# Patient Record
Sex: Female | Born: 1995 | Race: Black or African American | Hispanic: No | Marital: Single | Smoking: Former smoker
Health system: Southern US, Community
[De-identification: ages and names within clinical notes are randomized; demographics above are authoritative.]

## PROBLEM LIST (undated history)

## (undated) ENCOUNTER — Inpatient Hospital Stay (HOSPITAL_COMMUNITY): Payer: Self-pay

## (undated) ENCOUNTER — Ambulatory Visit: Source: Home / Self Care

## (undated) DIAGNOSIS — N83209 Unspecified ovarian cyst, unspecified side: Secondary | ICD-10-CM

## (undated) DIAGNOSIS — I10 Essential (primary) hypertension: Secondary | ICD-10-CM

## (undated) DIAGNOSIS — A749 Chlamydial infection, unspecified: Secondary | ICD-10-CM

## (undated) HISTORY — PX: NO PAST SURGERIES: SHX2092

---
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
## 2013-12-02 ENCOUNTER — Encounter (HOSPITAL_COMMUNITY): Payer: Self-pay

## 2013-12-20 ENCOUNTER — Inpatient Hospital Stay (HOSPITAL_COMMUNITY)
Admission: AD | Admit: 2013-12-20 | Discharge: 2013-12-20 | Disposition: A | Discharge status: Home / Self Care | Payer: Self-pay | Source: Ambulatory Visit | Attending: Family Medicine | Admitting: Family Medicine

## 2013-12-20 ENCOUNTER — Encounter (HOSPITAL_COMMUNITY): Payer: Self-pay

## 2013-12-20 DIAGNOSIS — N898 Other specified noninflammatory disorders of vagina: Secondary | ICD-10-CM | POA: Insufficient documentation

## 2013-12-20 DIAGNOSIS — Z3202 Encounter for pregnancy test, result negative: Secondary | ICD-10-CM | POA: Insufficient documentation

## 2013-12-20 DIAGNOSIS — N926 Irregular menstruation, unspecified: Secondary | ICD-10-CM

## 2013-12-20 DIAGNOSIS — Z113 Encounter for screening for infections with a predominantly sexual mode of transmission: Secondary | ICD-10-CM

## 2013-12-20 LAB — RAPID HIV SCREEN (WH-MAU): Rapid HIV Screen: NONREACTIVE

## 2013-12-20 LAB — WET PREP, GENITAL
Clue Cells Wet Prep HPF POC: NONE SEEN
Trich, Wet Prep: NONE SEEN
YEAST WET PREP: NONE SEEN

## 2013-12-20 LAB — POCT PREGNANCY, URINE: PREG TEST UR: NEGATIVE

## 2013-12-20 LAB — URINALYSIS, ROUTINE W REFLEX MICROSCOPIC
Bilirubin Urine: NEGATIVE
GLUCOSE, UA: NEGATIVE mg/dL
HGB URINE DIPSTICK: NEGATIVE
Ketones, ur: NEGATIVE mg/dL
LEUKOCYTES UA: NEGATIVE
NITRITE: NEGATIVE
Protein, ur: NEGATIVE mg/dL
Specific Gravity, Urine: 1.02 (ref 1.005–1.030)
UROBILINOGEN UA: 0.2 mg/dL (ref 0.0–1.0)
pH: 7 (ref 5.0–8.0)

## 2013-12-20 NOTE — MAU Note (Signed)
Pt presents complaining of a missed period and wants STD testing. Pt has missed a period in November and has history of irregular periods. Complaining of white discharge in underwear

## 2013-12-20 NOTE — Discharge Instructions (Signed)
Secondary Amenorrhea  Secondary amenorrhea is the stopping of menstrual flow for 3-6 months in a female who has previously had periods. There are many possible causes. Most of these causes are not serious. Usually, treating the underlying problem causing the loss of menses will return your periods to normal. CAUSES  Some common and uncommon causes of not menstruating include:  Malnutrition.  Low blood sugar (hypoglycemia).  Polycystic ovary disease.  Stress or fear.  Breastfeeding.  Hormone imbalance.  Ovarian failure.  Medicines.  Extreme obesity.  Cystic fibrosis.  Low body weight or drastic weight reduction from any cause.  Early menopause.  Removal of ovaries or uterus.  Contraceptives.  Illness.  Long-term (chronic) illnesses.  Cushing syndrome.  Thyroid problems.  Birth control pills, patches, or vaginal rings for birth control. RISK FACTORS You may be at greater risk of secondary amenorrhea if:  You have a family history of this condition.  You have an eating disorder.  You do athletic training. DIAGNOSIS  A diagnosis is made by your health care provider taking a medical history and doing a physical exam. This will include a pelvic exam to check for problems with your reproductive organs. Pregnancy must be ruled out. Often, numerous blood tests are done to measure different hormones in the body. Urine testing may be done. Specialized exams (ultrasound, CT scan, MRI, or hysteroscopy) may have to be done as well as measuring the body mass index (BMI). TREATMENT  Treatment depends on the cause of the amenorrhea. If an eating disorder is present, this can be treated with an adequate diet and therapy. Chronic illnesses may improve with treatment of the illness. Amenorrhea may be corrected with medicines, lifestyle changes, or surgery. If the amenorrhea cannot be corrected, it is sometimes possible to create a false menstruation with medicines. HOME CARE  INSTRUCTIONS  Maintain a healthy diet.  Manage weight problems.  Exercise regularly but not excessively.  Get adequate sleep.  Manage stress.  Be aware of changes in your menstrual cycle. Keep a record of when your periods occur. Note the date your period starts, how long it lasts, and any problems. SEEK MEDICAL CARE IF: Your symptoms do not get better with treatment. Document Released: 02/28/2006 Document Revised: 09/19/2012 Document Reviewed: 07/05/2012 [facility] Patient Information 2015 [place], [place]. This information is not intended to replace advice given to you by your health care provider. Make sure you discuss any questions you have with your health care provider. Safe Sex Safe sex is about reducing the risk of giving or getting a sexually transmitted disease (STD). STDs are spread through sexual contact involving the genitals, mouth, or rectum. Some STDs can be cured and others cannot. Safe sex can also prevent unintended pregnancies.  WHAT ARE SOME SAFE SEX PRACTICES?  Limit your sexual activity to only one partner who is having sex with only you.  Talk to your partner about his or her past partners, past STDs, and drug use.  Use a condom every time you have sexual intercourse. This includes vaginal, oral, and anal sexual activity. Both females and males should wear condoms during oral sex. Only use latex or polyurethane condoms and water-based lubricants. Using petroleum-based lubricants or oils to lubricate a condom will weaken the condom and increase the chance that it will break. The condom should be in place from the beginning to the end of sexual activity. Wearing a condom reduces, but does not completely eliminate, your risk of getting or giving an STD. STDs can be spread  by contact with infected body fluids and skin.  Get vaccinated for hepatitis B and HPV.  Avoid alcohol and recreational drugs, which can affect your judgment. You may forget to use a condom or  participate in high-risk sex.  For females, avoid douching after sexual intercourse. Douching can spread an infection farther into the reproductive tract.  Check your body for signs of sores, blisters, rashes, or unusual discharge. See your health care provider if you notice any of these signs.  Avoid sexual contact if you have symptoms of an infection or are being treated for an STD. If you or your partner has herpes, avoid sexual contact when blisters are present. Use condoms at all other times.  If you are at risk of being infected with HIV, it is recommended that you take a prescription medicine daily to prevent HIV infection. This is called pre-exposure prophylaxis (PrEP). You are considered at risk if:  You are a man who has sex with other men (MSM).  You are a heterosexual man or woman who is sexually active with more than one partner.  You take drugs by injection.  You are sexually active with a partner who has HIV.  Talk with your health care provider about whether you are at high risk of being infected with HIV. If you choose to begin PrEP, you should first be tested for HIV. You should then be tested every 3 months for as long as you are taking PrEP.  See your health care provider for regular screenings, exams, and tests for other STDs. Before having sex with a new partner, each of you should be screened for STDs and should talk about the results with each other. WHAT ARE THE BENEFITS OF SAFE SEX?   There is less chance of getting or giving an STD.  You can prevent unwanted or unintended pregnancies.  By discussing safe sex concerns with your partner, you may increase feelings of intimacy, comfort, trust, and honesty between the two of you. Document Released: 02/25/2004 Document Revised: 06/03/2013 Document Reviewed: 07/11/2011 [facility] Patient Information 2015 [place], [place]. This information is not intended to replace advice given to you by your health care provider. Make  sure you discuss any questions you have with your health care provider.

## 2013-12-20 NOTE — MAU Provider Note (Signed)
History     CSN: [id number]  Arrival date and time: 12/20/13 1155   None     Chief Complaint  Patient presents with  . Amenorrhea  . Abdominal Pain   HPI Morgan Kirk is 18 y.o. G1P0010 Unknown weeks presenting with missed period and requesting STI testing.  She specifically wants to know HIV status as soon as possible.  She has regular cycles lasting 5 days.  October cycle was only 3 days, no bleeding this month.  She is concerned her boyfriend may have been with someone else--had Chlamydia 6 months ago.  Has been using condoms everytime since treated for Chlamydia..  She is having white discharge without odor.   Past Medical History  Diagnosis Date  . Ovarian cyst   . Chlamydia   . Ovarian cyst     Past Surgical History  Procedure Laterality Date  . No past surgeries      History reviewed. No pertinent family history.  History  Substance Use Topics  . Smoking status: Current Some Day Smoker    Types: Cigarettes  . Smokeless tobacco: Never Used  . Alcohol Use: No    Allergies: No Known Allergies  Prescriptions prior to admission  Medication Sig Dispense Refill Last Dose  . metroNIDAZOLE (FLAGYL) 500 MG tablet Take 1 tablet (500 mg total) by mouth 2 (two) times daily. (Patient not taking: Reported on 12/20/2013) 14 tablet 0     Review of Systems  Constitutional: Negative for fever and chills.  Gastrointestinal: Negative for abdominal pain.  Genitourinary: Negative for dysuria, urgency, frequency and hematuria.       Neg for bleeding.  Positive for vaginal discharge without odor  Neurological: Negative for headaches.   Physical Exam   Blood pressure 137/100, pulse 75, temperature 98 F (36.7 C), temperature source Oral, resp. rate 18, last menstrual period 11/02/2013.  Physical Exam  Constitutional: She appears well-developed and well-nourished. No distress.  HENT:  Head: Normocephalic.  Neck: Normal range of motion.  Cardiovascular: Normal rate.    Respiratory: Effort normal.  GI: Soft. She exhibits no distension and no mass. There is no tenderness. There is no rebound and no guarding.  Genitourinary: There is no rash, tenderness or lesion on the right labia. There is no rash, tenderness or lesion on the left labia. Uterus is not enlarged and not tender. Cervix exhibits no motion tenderness, no discharge and no friability. Right adnexum displays no mass, no tenderness and no fullness. Left adnexum displays no mass, no tenderness and no fullness. No erythema or bleeding in the vagina. No foreign body around the vagina. Vaginal discharge (small amount of white discharge without odor) found.  Skin: Skin is warm and dry.  Psychiatric: She has a normal mood and affect. Her behavior is normal.    Results for orders placed or performed during the hospital encounter of 12/20/13 (from the past 24 hour(s))  Urinalysis, Routine w reflex microscopic     Status: Abnormal   Collection Time: 12/20/13 12:07 PM  Result Value Ref Range   Color, Urine YELLOW YELLOW   APPearance HAZY (A) CLEAR   Specific Gravity, Urine 1.020 1.005 - 1.030   pH 7.0 5.0 - 8.0   Glucose, UA NEGATIVE NEGATIVE mg/dL   Hgb urine dipstick NEGATIVE NEGATIVE   Bilirubin Urine NEGATIVE NEGATIVE   Ketones, ur NEGATIVE NEGATIVE mg/dL   Protein, ur NEGATIVE NEGATIVE mg/dL   Urobilinogen, UA 0.2 0.0 - 1.0 mg/dL   Nitrite NEGATIVE NEGATIVE  Leukocytes, UA NEGATIVE NEGATIVE  Pregnancy, urine POC     Status: None   Collection Time: 12/20/13 12:12 PM  Result Value Ref Range   Preg Test, Ur NEGATIVE NEGATIVE  Wet prep, genital     Status: Abnormal   Collection Time: 12/20/13 12:50 PM  Result Value Ref Range   Yeast Wet Prep HPF POC NONE SEEN NONE SEEN   Trich, Wet Prep NONE SEEN NONE SEEN   Clue Cells Wet Prep HPF POC NONE SEEN NONE SEEN   WBC, Wet Prep HPF POC FEW (A) NONE SEEN  Rapid HIV screen     Status: None   Collection Time: 12/20/13  1:00 PM  Result Value Ref Range    SUDS Rapid HIV Screen NON REACTIVE NON REACTIVE   MAU Course  Procedures    GC/CHL cultures pending MDM   Assessment and Plan  A;  Missed period      Negative UPT     Vaginal discharge  P: Continue condom use every intercourse      Suggested she consider contraception if she does not desire pregnancy      Will call patient if cultures are positive  [name],EVE M 12/20/2013, 2:05 PM

## 2013-12-21 LAB — GC/CHLAMYDIA PROBE AMP
CT Probe RNA: NEGATIVE
GC Probe RNA: NEGATIVE

## 2014-03-19 ENCOUNTER — Encounter: Payer: Self-pay | Admitting: Obstetrics & Gynecology

## 2014-04-03 ENCOUNTER — Encounter: Payer: Self-pay | Admitting: Family

## 2014-07-16 ENCOUNTER — Encounter (HOSPITAL_COMMUNITY): Payer: Self-pay

## 2014-07-16 ENCOUNTER — Inpatient Hospital Stay (HOSPITAL_COMMUNITY)
Admission: AD | Admit: 2014-07-16 | Discharge: 2014-07-16 | Disposition: A | Discharge status: Home / Self Care | Payer: Self-pay | Source: Ambulatory Visit | Attending: Obstetrics and Gynecology | Admitting: Obstetrics and Gynecology

## 2014-07-16 DIAGNOSIS — A599 Trichomoniasis, unspecified: Secondary | ICD-10-CM

## 2014-07-16 DIAGNOSIS — Z87891 Personal history of nicotine dependence: Secondary | ICD-10-CM | POA: Insufficient documentation

## 2014-07-16 DIAGNOSIS — Z7251 High risk heterosexual behavior: Secondary | ICD-10-CM | POA: Insufficient documentation

## 2014-07-16 DIAGNOSIS — B3731 Acute candidiasis of vulva and vagina: Secondary | ICD-10-CM

## 2014-07-16 DIAGNOSIS — B373 Candidiasis of vulva and vagina: Secondary | ICD-10-CM | POA: Insufficient documentation

## 2014-07-16 DIAGNOSIS — A5901 Trichomonal vulvovaginitis: Secondary | ICD-10-CM | POA: Insufficient documentation

## 2014-07-16 LAB — URINALYSIS, ROUTINE W REFLEX MICROSCOPIC
Glucose, UA: NEGATIVE mg/dL
Hgb urine dipstick: NEGATIVE
Ketones, ur: 15 mg/dL — AB
Nitrite: NEGATIVE
PROTEIN: NEGATIVE mg/dL
Specific Gravity, Urine: >1.03 — ABNORMAL HIGH (ref 1.005–1.030)
UROBILINOGEN UA: 0.2 mg/dL (ref 0.0–1.0)
pH: 6 (ref 5.0–8.0)

## 2014-07-16 LAB — POCT PREGNANCY, URINE: Preg Test, Ur: NEGATIVE

## 2014-07-16 LAB — WET PREP, GENITAL: CLUE CELLS WET PREP: NONE SEEN

## 2014-07-16 LAB — URINE MICROSCOPIC-ADD ON

## 2014-07-16 MED ORDER — METRONIDAZOLE 500 MG PO TABS
2000.0000 mg | ORAL_TABLET | Freq: Once | ORAL | Status: AC
  Administered 2014-07-16: 2000 mg via ORAL
  Filled 2014-07-16: qty 4

## 2014-07-16 MED ORDER — FLUCONAZOLE 150 MG PO TABS: 150.0000 mg | ORAL_TABLET | Freq: Once | ORAL | Status: DC

## 2014-07-16 NOTE — Discharge Instructions (Signed)
Trichomoniasis Trichomoniasis is an infection caused by an organism called Trichomonas. The infection can affect both women and men. In women, the outer female genitalia and the vagina are affected. In men, the penis is mainly affected, but the prostate and other reproductive organs can also be involved. Trichomoniasis is a sexually transmitted infection (STI) and is most often passed to another person through sexual contact.  RISK FACTORS  Having unprotected sexual intercourse.  Having sexual intercourse with an infected partner. SIGNS AND SYMPTOMS  Symptoms of trichomoniasis in women include:  Abnormal gray-green frothy vaginal discharge.  Itching and irritation of the vagina.  Itching and irritation of the area outside the vagina. Symptoms of trichomoniasis in men include:   Penile discharge with or without pain.  Pain during urination. This results from inflammation of the urethra. DIAGNOSIS  Trichomoniasis may be found during a Pap test or physical exam. Your health care provider may use one of the following methods to help diagnose this infection:  Examining vaginal discharge under a microscope. For men, urethral discharge would be examined.  Testing the pH of the vagina with a test tape.  Using a vaginal swab test that checks for the Trichomonas organism. A test is available that provides results within a few minutes.  Doing a culture test for the organism. This is not usually needed. TREATMENT   You may be given medicine to fight the infection. Women should inform their health care provider if they could be or are pregnant. Some medicines used to treat the infection should not be taken during pregnancy.  Your health care provider may recommend over-the-counter medicines or creams to decrease itching or irritation.  Your sexual partner will need to be treated if infected. HOME CARE INSTRUCTIONS   Take medicines only as directed by your health care provider.  Take  over-the-counter medicine for itching or irritation as directed by your health care provider.  Do not have sexual intercourse while you have the infection.  Women should not douche or wear tampons while they have the infection.  Discuss your infection with your partner. Your partner may have gotten the infection from you, or you may have gotten it from your partner.  Have your sex partner get examined and treated if necessary.  Practice safe, informed, and protected sex.  See your health care provider for other STI testing. SEEK MEDICAL CARE IF:   You still have symptoms after you finish your medicine.  You develop abdominal pain.  You have pain when you urinate.  You have bleeding after sexual intercourse.  You develop a rash.  Your medicine makes you sick or makes you throw up (vomit). MAKE SURE YOU:  Understand these instructions.  Will watch your condition.  Will get help right away if you are not doing well or get worse. Document Released: 07/13/2000 Document Revised: 06/03/2013 Document Reviewed: 10/29/2012 [facility] Patient Information 2015 [place], [place]. This information is not intended to replace advice given to you by your health care provider. Make sure you discuss any questions you have with your health care provider.  Candidal Vulvovaginitis Candidal vulvovaginitis is an infection of the vagina and vulva. The vulva is the skin around the opening of the vagina. This may cause itching and discomfort in and around the vagina.  HOME CARE  Only take medicine as told by your doctor.  Do not have sex (intercourse) until the infection is healed or as told by your doctor.  Practice safe sex.  Tell your sex partner about  your infection.  Do not douche or use tampons.  Wear cotton underwear. Do not wear tight pants or panty hose.  Eat yogurt. This may help treat and prevent yeast infections. GET HELP RIGHT AWAY IF:   You have a fever.  Your problems get  worse during treatment or do not get better in 3 days.  You have discomfort, irritation, or itching in your vagina or vulva area.  You have pain after sex.  You start to get belly (abdominal) pain. MAKE SURE YOU:  Understand these instructions.  Will watch your condition.  Will get help right away if you are not doing well or get worse. Document Released: 04/15/2008 Document Revised: 01/22/2013 Document Reviewed: 04/15/2008 [facility] Patient Information 2015 [place], [place]. This information is not intended to replace advice given to you by your health care provider. Make sure you discuss any questions you have with your health care provider.

## 2014-07-16 NOTE — MAU Note (Signed)
Past 3 days has been having itching down in her vagina.  Last night it seemed swollen.  Recently had unprotected sex, so is a little scared

## 2014-07-16 NOTE — MAU Provider Note (Signed)
History     CSN: [id number]  Arrival date and time: 07/16/14 1825   First Provider Initiated Contact with Patient 07/16/14 2109      Chief Complaint  Patient presents with  . Vaginal Itching   HPI Comments: Morgan Kirk is a 19 y.o. G1P0010 who presents today with vaginal itching and irritation x 2 days. She has not tried anything for these symptoms. She has had unprotected intercourse recently. She would like HIV testing today in addition to GC/CT testing.   Vaginal Itching The patient's primary symptoms include genital itching and vaginal discharge. This is a new problem. The current episode started yesterday. The problem occurs constantly. The patient is experiencing no pain. She is not pregnant. Pertinent negatives include no abdominal pain, dysuria, fever, frequency, nausea, urgency or vomiting. The vaginal discharge was thin and malodorous. There has been no bleeding. She is sexually active. It is possible that her partner has an STD. She uses nothing for contraception.    Past Medical History  Diagnosis Date  . Ovarian cyst   . Chlamydia   . Ovarian cyst     Past Surgical History  Procedure Laterality Date  . No past surgeries      History reviewed. No pertinent family history.  History  Substance Use Topics  . Smoking status: Former Smoker    Types: Cigarettes  . Smokeless tobacco: Never Used  . Alcohol Use: Yes     Comment: occasional    Allergies: No Known Allergies  No prescriptions prior to admission    Review of Systems  Constitutional: Negative for fever.  Gastrointestinal: Negative for nausea, vomiting and abdominal pain.  Genitourinary: Positive for vaginal discharge. Negative for dysuria, urgency and frequency.   Physical Exam   Blood pressure 125/89, pulse 80, temperature 98.6 F (37 C), temperature source Oral, resp. rate 16, height 5' 4.5" (1.638 m), weight 65.772 kg (145 lb), last menstrual period 06/25/2014.  Physical Exam  Nursing  note and vitals reviewed. Constitutional: She is oriented to person, place, and time. She appears well-developed and well-nourished. No distress.  HENT:  Head: Normocephalic.  Cardiovascular: Normal rate.   Respiratory: Effort normal.  GI: Soft. There is no tenderness. There is no rebound.  Neurological: She is alert and oriented to person, place, and time.  Skin: Skin is warm and dry.  Psychiatric: She has a normal mood and affect.   Results for orders placed or performed during the hospital encounter of 07/16/14 (from the past 24 hour(s))  Urinalysis, Routine w reflex microscopic (not at [facility])     Status: Abnormal   Collection Time: 07/16/14  6:40 PM  Result Value Ref Range   Color, Urine YELLOW YELLOW   APPearance CLEAR CLEAR   Specific Gravity, Urine >1.030 (H) 1.005 - 1.030   pH 6.0 5.0 - 8.0   Glucose, UA NEGATIVE NEGATIVE mg/dL   Hgb urine dipstick NEGATIVE NEGATIVE   Bilirubin Urine SMALL (A) NEGATIVE   Ketones, ur 15 (A) NEGATIVE mg/dL   Protein, ur NEGATIVE NEGATIVE mg/dL   Urobilinogen, UA 0.2 0.0 - 1.0 mg/dL   Nitrite NEGATIVE NEGATIVE   Leukocytes, UA SMALL (A) NEGATIVE  Urine microscopic-add on     Status: Abnormal   Collection Time: 07/16/14  6:40 PM  Result Value Ref Range   Squamous Epithelial / LPF FEW (A) RARE   WBC, UA 3-6 <3 WBC/hpf   RBC / HPF 0-2 <3 RBC/hpf   Bacteria, UA FEW (A) RARE   Urine-Other  MUCOUS PRESENT   Pregnancy, urine POC     Status: None   Collection Time: 07/16/14  7:38 PM  Result Value Ref Range   Preg Test, Ur NEGATIVE NEGATIVE  Wet prep, genital     Status: Abnormal   Collection Time: 07/16/14  8:15 PM  Result Value Ref Range   Yeast Wet Prep HPF POC FEW (A) NONE SEEN   Trich, Wet Prep FEW (A) NONE SEEN   Clue Cells Wet Prep HPF POC NONE SEEN NONE SEEN   WBC, Wet Prep HPF POC FEW (A) NONE SEEN    MAU Course  Procedures  MDM   Assessment and Plan   1. Trichomonal infection   2. Yeast infection involving the vagina and  surrounding area   3. High risk sexual behavior    DC home Safe sex practices discussed Patient reccommended to establish care for GYN/Well woman care RX: diflucan #2, 0RF  Return to MAU as needed   Follow-up Information    Schedule an appointment as soon as possible for a visit with [facility] DEPT [place].   Contact information:   [address] [facility] [place] [address] [phone]       Tawnya Crook 07/16/2014, 9:11 PM

## 2014-07-16 NOTE — Progress Notes (Signed)
Informed patient of wet prep results.

## 2014-07-17 LAB — GC/CHLAMYDIA PROBE AMP (~~LOC~~) NOT AT ARMC
CHLAMYDIA, DNA PROBE: NEGATIVE
Neisseria Gonorrhea: NEGATIVE

## 2014-07-17 LAB — HIV ANTIBODY (ROUTINE TESTING W REFLEX): HIV Screen 4th Generation wRfx: NONREACTIVE

## 2014-07-21 ENCOUNTER — Telehealth (HOSPITAL_COMMUNITY): Payer: Self-pay | Admitting: *Deleted

## 2014-10-25 ENCOUNTER — Emergency Department (HOSPITAL_COMMUNITY)
Admission: EM | Admit: 2014-10-25 | Discharge: 2014-10-26 | Discharge status: Against Medical Advice | Payer: Self-pay | Attending: Emergency Medicine | Admitting: Emergency Medicine

## 2014-10-25 ENCOUNTER — Encounter (HOSPITAL_COMMUNITY): Payer: Self-pay

## 2014-10-25 DIAGNOSIS — R51 Headache: Secondary | ICD-10-CM | POA: Insufficient documentation

## 2014-10-25 LAB — COMPREHENSIVE METABOLIC PANEL
ALT: 15 U/L (ref 14–54)
AST: 17 U/L (ref 15–41)
Albumin: 4.1 g/dL (ref 3.5–5.0)
Alkaline Phosphatase: 63 U/L (ref 38–126)
Anion gap: 7 (ref 5–15)
BUN: 12 mg/dL (ref 6–20)
CALCIUM: 9.3 mg/dL (ref 8.9–10.3)
CHLORIDE: 106 mmol/L (ref 101–111)
CO2: 26 mmol/L (ref 22–32)
CREATININE: 0.79 mg/dL (ref 0.44–1.00)
GFR calc Af Amer: >60 mL/min (ref >60)
Glucose, Bld: 99 mg/dL (ref 65–99)
Potassium: 3.4 mmol/L — ABNORMAL LOW (ref 3.5–5.1)
Sodium: 139 mmol/L (ref 135–145)
Total Bilirubin: 0.4 mg/dL (ref 0.3–1.2)
Total Protein: 7.3 g/dL (ref 6.5–8.1)

## 2014-10-25 LAB — CBC
HCT: 39.4 % (ref 36.0–46.0)
Hemoglobin: 13.4 g/dL (ref 12.0–15.0)
MCH: 30.5 pg (ref 26.0–34.0)
MCHC: 34 g/dL (ref 30.0–36.0)
MCV: 89.5 fL (ref 78.0–100.0)
Platelets: 272 10*3/uL (ref 150–400)
RBC: 4.4 MIL/uL (ref 3.87–5.11)
RDW: 12.9 % (ref 11.5–15.5)
WBC: 6.1 10*3/uL (ref 4.0–10.5)

## 2014-10-25 LAB — LIPASE, BLOOD: Lipase: 16 U/L — ABNORMAL LOW (ref 22–51)

## 2014-10-25 MED ORDER — ONDANSETRON 4 MG PO TBDP
4.0000 mg | ORAL_TABLET | Freq: Once | ORAL | Status: AC | PRN
  Administered 2014-10-25: 4 mg via ORAL

## 2014-10-25 MED ORDER — ONDANSETRON 4 MG PO TBDP
ORAL_TABLET | ORAL | Status: AC
  Filled 2014-10-25: qty 1

## 2014-10-25 NOTE — ED Notes (Signed)
Pt reports worsening headache that started 3 weeks ago. She also is having cold like symptoms, nasal congestion, sore throat, sharp pains in abdomen and back, nausea and vomiting. Pt also wants a pregnancy test because she hasn't had a period since May or June. Pt vomited x 1 during triage.

## 2015-01-25 ENCOUNTER — Emergency Department (HOSPITAL_COMMUNITY): Payer: Self-pay

## 2015-01-25 ENCOUNTER — Encounter (HOSPITAL_COMMUNITY): Payer: Self-pay | Admitting: Emergency Medicine

## 2015-01-25 ENCOUNTER — Emergency Department (HOSPITAL_COMMUNITY)
Admission: EM | Admit: 2015-01-25 | Discharge: 2015-01-25 | Disposition: A | Discharge status: Home / Self Care | Payer: Self-pay | Attending: Emergency Medicine | Admitting: Emergency Medicine

## 2015-01-25 DIAGNOSIS — N739 Female pelvic inflammatory disease, unspecified: Secondary | ICD-10-CM | POA: Insufficient documentation

## 2015-01-25 DIAGNOSIS — N730 Acute parametritis and pelvic cellulitis: Secondary | ICD-10-CM

## 2015-01-25 DIAGNOSIS — Z8619 Personal history of other infectious and parasitic diseases: Secondary | ICD-10-CM | POA: Insufficient documentation

## 2015-01-25 DIAGNOSIS — E876 Hypokalemia: Secondary | ICD-10-CM | POA: Insufficient documentation

## 2015-01-25 DIAGNOSIS — Z87891 Personal history of nicotine dependence: Secondary | ICD-10-CM | POA: Insufficient documentation

## 2015-01-25 DIAGNOSIS — R102 Pelvic and perineal pain: Secondary | ICD-10-CM

## 2015-01-25 DIAGNOSIS — Z3202 Encounter for pregnancy test, result negative: Secondary | ICD-10-CM | POA: Insufficient documentation

## 2015-01-25 LAB — CBC WITH DIFFERENTIAL/PLATELET
BASOS ABS: 0 10*3/uL (ref 0.0–0.1)
Basophils Relative: 0 %
Eosinophils Absolute: 0 10*3/uL (ref 0.0–0.7)
Eosinophils Relative: 0 %
HCT: 37.5 % (ref 36.0–46.0)
Hemoglobin: 12.7 g/dL (ref 12.0–15.0)
LYMPHS ABS: 1.1 10*3/uL (ref 0.7–4.0)
LYMPHS PCT: 13 %
MCH: 30.4 pg (ref 26.0–34.0)
MCHC: 33.9 g/dL (ref 30.0–36.0)
MCV: 89.7 fL (ref 78.0–100.0)
Monocytes Absolute: 1.1 10*3/uL — ABNORMAL HIGH (ref 0.1–1.0)
Monocytes Relative: 13 %
Neutro Abs: 6.1 10*3/uL (ref 1.7–7.7)
Neutrophils Relative %: 74 %
Platelets: 254 10*3/uL (ref 150–400)
RBC: 4.18 MIL/uL (ref 3.87–5.11)
RDW: 12.7 % (ref 11.5–15.5)
WBC: 8.3 10*3/uL (ref 4.0–10.5)

## 2015-01-25 LAB — URINALYSIS, ROUTINE W REFLEX MICROSCOPIC
BILIRUBIN URINE: NEGATIVE
Glucose, UA: NEGATIVE mg/dL
Hgb urine dipstick: NEGATIVE
Ketones, ur: NEGATIVE mg/dL
Leukocytes, UA: NEGATIVE
NITRITE: NEGATIVE
Protein, ur: NEGATIVE mg/dL
SPECIFIC GRAVITY, URINE: 1.028 (ref 1.005–1.030)
pH: 6 (ref 5.0–8.0)

## 2015-01-25 LAB — POC URINE PREG, ED: PREG TEST UR: NEGATIVE

## 2015-01-25 LAB — WET PREP, GENITAL
CLUE CELLS WET PREP: NONE SEEN
SPERM: NONE SEEN
Trich, Wet Prep: NONE SEEN
WBC, Wet Prep HPF POC: NONE SEEN
Yeast Wet Prep HPF POC: NONE SEEN

## 2015-01-25 LAB — RAPID HIV SCREEN (HIV 1/2 AB+AG)
HIV 1/2 ANTIBODIES: NONREACTIVE
HIV-1 P24 Antigen - HIV24: NONREACTIVE

## 2015-01-25 LAB — I-STAT CHEM 8, ED
BUN: 11 mg/dL (ref 6–20)
CALCIUM ION: 1.12 mmol/L (ref 1.12–1.23)
Chloride: 102 mmol/L (ref 101–111)
Creatinine, Ser: 0.8 mg/dL (ref 0.44–1.00)
Glucose, Bld: 115 mg/dL — ABNORMAL HIGH (ref 65–99)
HCT: 40 % (ref 36.0–46.0)
Hemoglobin: 13.6 g/dL (ref 12.0–15.0)
POTASSIUM: 3 mmol/L — AB (ref 3.5–5.1)
Sodium: 138 mmol/L (ref 135–145)
TCO2: 24 mmol/L (ref 0–100)

## 2015-01-25 LAB — HIV ANTIBODY (ROUTINE TESTING W REFLEX): HIV Screen 4th Generation wRfx: NONREACTIVE

## 2015-01-25 MED ORDER — ONDANSETRON HCL 4 MG/2ML IJ SOLN
4.0000 mg | Freq: Once | INTRAMUSCULAR | Status: AC
  Administered 2015-01-25: 4 mg via INTRAVENOUS
  Filled 2015-01-25: qty 2

## 2015-01-25 MED ORDER — AZITHROMYCIN 250 MG PO TABS
1000.0000 mg | ORAL_TABLET | Freq: Once | ORAL | Status: AC
  Administered 2015-01-25: 1000 mg via ORAL
  Filled 2015-01-25: qty 4

## 2015-01-25 MED ORDER — CEFTRIAXONE SODIUM 250 MG IJ SOLR
250.0000 mg | Freq: Once | INTRAMUSCULAR | Status: AC
  Administered 2015-01-25: 250 mg via INTRAMUSCULAR
  Filled 2015-01-25: qty 250

## 2015-01-25 MED ORDER — MORPHINE SULFATE (PF) 4 MG/ML IV SOLN
4.0000 mg | Freq: Once | INTRAVENOUS | Status: AC
  Administered 2015-01-25: 4 mg via INTRAVENOUS
  Filled 2015-01-25: qty 1

## 2015-01-25 MED ORDER — SODIUM CHLORIDE 0.9 % IV BOLUS (SEPSIS)
1000.0000 mL | Freq: Once | INTRAVENOUS | Status: AC
  Administered 2015-01-25: 1000 mL via INTRAVENOUS

## 2015-01-25 MED ORDER — POTASSIUM CHLORIDE CRYS ER 20 MEQ PO TBCR
20.0000 meq | EXTENDED_RELEASE_TABLET | Freq: Once | ORAL | Status: AC
  Administered 2015-01-25: 20 meq via ORAL
  Filled 2015-01-25 (×2): qty 1

## 2015-01-25 NOTE — ED Notes (Signed)
Per EMS , pt. From home with complaint flu like symptoms which started Wednesday, today pt. Has temp. Of 102'F , 1gm tylenol given by EMS.

## 2015-01-25 NOTE — ED Notes (Signed)
Ultrasound at bedside

## 2015-01-25 NOTE — ED Notes (Signed)
ED PA at bedside

## 2015-01-25 NOTE — ED Provider Notes (Signed)
CSN: [id number]     Arrival date & time 01/25/15  0422 History   First MD Initiated Contact with Patient 01/25/15 0602     Chief Complaint  Patient presents with  . flulike    . Fever   (Consider location/radiation/quality/duration/timing/severity/associated sxs/prior Treatment) Patient is a 19 y.o. female presenting with fever. The history is provided by the patient. No language interpreter was used.  Fever Associated symptoms: nausea and vomiting     Morgan Kirk is a 19 y.o female with a history of chlamydia and ovarian cyst who presents by EMS from home with complaint of worsening bilateral pelvic pain for the past 2-3 days. She states that she had a fever (102.0) on the way to the ED and was given 1000 mg of Tylenol by EMS. She had 3-4 episodes of vomiting last night and one episode this morning. Her last menstrual period was 3 weeks ago. She is sexually active. She is not on birth control. She reports having similar symptoms in the past with an ovarian cyst. She denies any diarrhea, constipation, dysuria, hematuria, urinary frequency, vaginal discharge or bleeding, vaginal odor or itching.    Past Medical History  Diagnosis Date  . Ovarian cyst   . Chlamydia   . Ovarian cyst    Past Surgical History  Procedure Laterality Date  . No past surgeries     History reviewed. No pertinent family history. Social History  Substance Use Topics  . Smoking status: Former Smoker    Types: Cigarettes  . Smokeless tobacco: Never Used  . Alcohol Use: Yes     Comment: occasional   OB History    Gravida Para Term Preterm AB TAB SAB Ectopic Multiple Living   0     Review of Systems  Constitutional: Positive for fever.  Gastrointestinal: Positive for nausea and vomiting.  Genitourinary: Positive for pelvic pain.  All other systems reviewed and are negative.     Allergies  Review of patient's allergies indicates no known allergies.  Home Medications   Prior to  Admission medications   Medication Sig Start Date End Date Taking? Authorizing Provider  fluconazole (DIFLUCAN) 150 MG tablet Take 1 tablet (150 mg total) by mouth once. May repeat in 2 days if still having symptoms. Patient not taking: Reported on 01/25/2015 07/16/14   Armando Reichert, CNM   BP 132/97 mmHg  Pulse 95  Temp(Src) 98 F (36.7 C) (Oral)  Resp 16  Ht  (1.626 m)  Wt 65.772 kg  BMI 24.88 kg/m2  SpO2 99% Physical Exam  Constitutional: She is oriented to person, place, and time. She appears well-developed and well-nourished.  HENT:  Head: Normocephalic and atraumatic.  Eyes: Conjunctivae are normal.  Neck: Normal range of motion. Neck supple.  Cardiovascular: Normal rate, regular rhythm and normal heart sounds.   Pulmonary/Chest: Effort normal and breath sounds normal.  Abdominal: Soft. Normal appearance. She exhibits no distension. There is tenderness in the suprapubic area. There is no rebound, no guarding and no CVA tenderness.    Suprapubic tenderness to palpation. No guarding or rebound. No CVA tenderness. Normal-appearing abdomen. Soft. No distention.  Genitourinary:  Pelvic exam, chaperone present. Bilateral adnexal tenderness and CMT.  Copious amounts of white thick vaginal discharge. No vaginal bleeding. Cervical os is closed.  Musculoskeletal: Normal range of motion.  Neurological: She is alert and oriented to person, place, and time.  Skin: Skin is warm and dry.  Psychiatric: She has a normal mood and affect.  Nursing note and vitals reviewed.   ED Course  Procedures (including critical care time) Labs Review Labs Reviewed  URINALYSIS, ROUTINE W REFLEX MICROSCOPIC (NOT AT [facility]) - Abnormal; Notable for the following:    APPearance CLOUDY (*)    All other components within normal limits  CBC WITH DIFFERENTIAL/PLATELET - Abnormal; Notable for the following:    Monocytes Absolute 1.1 (*)    All other components within normal limits  I-STAT CHEM 8, ED -  Abnormal; Notable for the following:    Potassium 3.0 (*)    Glucose, Bld 115 (*)    All other components within normal limits  WET PREP, GENITAL  URINE CULTURE  RAPID HIV SCREEN (HIV 1/2 AB+AG)  HIV ANTIBODY (ROUTINE TESTING)  POC URINE PREG, ED  GC/CHLAMYDIA PROBE AMP ([place]) NOT AT [facility]    Imaging Review [place] Transvaginal Non-ob  01/25/2015  CLINICAL DATA:  Pelvic pain for 4 days EXAM: TRANSABDOMINAL AND TRANSVAGINAL ULTRASOUND OF PELVIS TECHNIQUE: Both transabdominal and transvaginal ultrasound examinations of the pelvis were performed. Transabdominal technique was performed for global imaging of the pelvis including uterus, ovaries, adnexal regions, and pelvic cul-de-sac. It was necessary to proceed with endovaginal exam following the transabdominal exam to visualize the uterus and bilateral ovaries. COMPARISON:  None FINDINGS: Uterus Measurements: 6.3 x 3.2 x 4 cm. No fibroids or other mass visualized. Endometrium Thickness: 6 mm.  No focal abnormality visualized. Right ovary Measurements: 3.4 x 2.2 x 2.3 cm. Normal appearance/no adnexal mass. Left ovary Measurements: 3.5 x 2.2 x 2.3 cm. Normal appearance/no adnexal mass. Other findings Trace free fluid is identified in the pelvis. IMPRESSION: No acute abnormality in the pelvis. Electronically Signed   By: Sherian ReinWei-Chen  Lin M.D.   On: 01/25/2015 07:58   [place] Pelvis Complete  01/25/2015  CLINICAL DATA:  Pelvic pain for 4 days EXAM: TRANSABDOMINAL AND TRANSVAGINAL ULTRASOUND OF PELVIS TECHNIQUE: Both transabdominal and transvaginal ultrasound examinations of the pelvis were performed. Transabdominal technique was performed for global imaging of the pelvis including uterus, ovaries, adnexal regions, and pelvic cul-de-sac. It was necessary to proceed with endovaginal exam following the transabdominal exam to visualize the uterus and bilateral ovaries. COMPARISON:  None FINDINGS: Uterus Measurements: 6.3 x 3.2 x 4 cm. No fibroids or other mass  visualized. Endometrium Thickness: 6 mm.  No focal abnormality visualized. Right ovary Measurements: 3.4 x 2.2 x 2.3 cm. Normal appearance/no adnexal mass. Left ovary Measurements: 3.5 x 2.2 x 2.3 cm. Normal appearance/no adnexal mass. Other findings Trace free fluid is identified in the pelvis. IMPRESSION: No acute abnormality in the pelvis. Electronically Signed   By: Sherian ReinWei-Chen  Lin M.D.   On: 01/25/2015 07:58   I have personally reviewed and evaluated these images and lab results as part of my medical decision-making.   EKG Interpretation None      MDM   Final diagnoses:  Pelvic pain in female  Hypokalemia  PID (acute pelvic inflammatory disease)  Patient presents for suprapubic pain and several episodes of vomiting for the past 2-3 days. She had a temperature of 102.0 in route with EMS and was given 1 g of Tylenol. Her temp is 99.1. She has hypokalemia otherwise her labs are normal. No UTI. She is not pregnant. Her wet prep was negative for WBC's, trich, or clue cells.  GC/Chlamydia is pending.  She has adnexal tenderness and CMT on exam.  I will treat her for PID since her symptoms are  consistent.   Medications  sodium chloride 0.9 % bolus 1,000 mL (0 mLs Intravenous Stopped 01/25/15 0948)  morphine 4 MG/ML injection 4 mg (4 mg Intravenous Given 01/25/15 0653)  ondansetron (ZOFRAN) injection 4 mg (4 mg Intravenous Given 01/25/15 0652)  potassium chloride SA (K-DUR,KLOR-CON) CR tablet 20 mEq (20 mEq Oral Given 01/25/15 0829)  azithromycin (ZITHROMAX) tablet 1,000 mg (1,000 mg Oral Given 01/25/15 0916)  cefTRIAXone (ROCEPHIN) injection 250 mg (250 mg Intramuscular Given 01/25/15 0916)   I discussed follow up as well as call back in a few days if STD results are positive. Return precautions were discussed and patient is agreeable to plan.    Catha Gosselin, PA-C 01/25/15 1420  Tomasita Crumble, MD 01/25/15 [phone]

## 2015-01-25 NOTE — ED Notes (Signed)
Bed: [id number] Expected date:  Expected time:  Means of arrival:  Comments: Ems-flulike symptoms

## 2015-01-25 NOTE — Discharge Instructions (Signed)
Pelvic Inflammatory Disease Pelvic inflammatory disease (PID) is an infection in some or all of the female organs. PID can be in the uterus, ovaries, fallopian tubes, or the surrounding tissues that are inside the lower belly area (pelvis). PID can lead to lasting problems if it is not treated. To check for this disease, your doctor may:  Do a physical exam.  Do blood tests, urine tests, or a pregnancy test.  Look at your vaginal discharge.  Do tests to look inside the pelvis.  Test you for other infections. HOME CARE  Take over-the-counter and prescription medicines only as told by your doctor.  If you were prescribed an antibiotic medicine, take it as told by your doctor. Do not stop taking it even if you start to feel better.  Do not have sex until treatment is done or as told by your doctor.  Tell your sex partner if you have PID. Your partner may need to be treated.  Keep all follow-up visits as told by your doctor. This is important.  Your doctor may test you for infection again 3 months after you are treated. GET HELP IF:  You have more fluid (discharge) coming from your vagina or fluid that is not normal.  Your pain does not improve.  You throw up (vomit).  You have a fever.  You cannot take your medicines.  Your partner has a sexually transmitted disease (STD).  You have pain when you pee (urinate). GET HELP RIGHT AWAY IF:  You have more belly (abdominal) or lower belly pain.  You have chills.  You are not better after 72 hours.   This information is not intended to replace advice given to you by your health care provider. Make sure you discuss any questions you have with your health care provider.   Document Released: 04/15/2008 Document Revised: 10/08/2014 Document Reviewed: 02/24/2014 Elsevier Interactive Patient Education 2016 [employer]  

## 2015-01-26 LAB — URINE CULTURE

## 2015-01-28 ENCOUNTER — Encounter: Payer: Self-pay | Admitting: Family Medicine

## 2015-01-30 LAB — GC/CHLAMYDIA PROBE AMP (~~LOC~~) NOT AT ARMC
Chlamydia: NEGATIVE
NEISSERIA GONORRHEA: NEGATIVE

## 2015-03-30 ENCOUNTER — Encounter (HOSPITAL_COMMUNITY): Payer: Self-pay | Admitting: Emergency Medicine

## 2015-03-30 ENCOUNTER — Other Ambulatory Visit (HOSPITAL_COMMUNITY)
Admission: RE | Admit: 2015-03-30 | Discharge: 2015-03-30 | Disposition: A | Discharge status: Home / Self Care | Payer: Self-pay | Source: Ambulatory Visit | Attending: Family Medicine | Admitting: Family Medicine

## 2015-03-30 ENCOUNTER — Emergency Department (INDEPENDENT_AMBULATORY_CARE_PROVIDER_SITE_OTHER)
Admission: EM | Admit: 2015-03-30 | Discharge: 2015-03-30 | Disposition: A | Discharge status: Home / Self Care | Payer: Self-pay | Source: Home / Self Care | Attending: Family Medicine | Admitting: Family Medicine

## 2015-03-30 DIAGNOSIS — B9689 Other specified bacterial agents as the cause of diseases classified elsewhere: Secondary | ICD-10-CM

## 2015-03-30 DIAGNOSIS — N760 Acute vaginitis: Secondary | ICD-10-CM

## 2015-03-30 DIAGNOSIS — Z113 Encounter for screening for infections with a predominantly sexual mode of transmission: Secondary | ICD-10-CM | POA: Insufficient documentation

## 2015-03-30 DIAGNOSIS — A499 Bacterial infection, unspecified: Secondary | ICD-10-CM

## 2015-03-30 LAB — POCT URINALYSIS DIP (DEVICE)
Bilirubin Urine: NEGATIVE
Glucose, UA: NEGATIVE mg/dL
Hgb urine dipstick: NEGATIVE
KETONES UR: NEGATIVE mg/dL
Nitrite: NEGATIVE
PH: 7 (ref 5.0–8.0)
PROTEIN: NEGATIVE mg/dL
SPECIFIC GRAVITY, URINE: 1.02 (ref 1.005–1.030)
UROBILINOGEN UA: 0.2 mg/dL (ref 0.0–1.0)

## 2015-03-30 LAB — POCT PREGNANCY, URINE: Preg Test, Ur: NEGATIVE

## 2015-03-30 MED ORDER — METRONIDAZOLE 500 MG PO TABS: 500.0000 mg | ORAL_TABLET | Freq: Three times a day (TID) | ORAL | Status: DC

## 2015-03-30 NOTE — Discharge Instructions (Signed)

## 2015-03-30 NOTE — ED Provider Notes (Signed)
CSN: [id number]     Arrival date & time 03/30/15  1819 History   First MD Initiated Contact with Patient 03/30/15 1918     No chief complaint on file.  (Consider location/radiation/quality/duration/timing/severity/associated sxs/prior Treatment) HPI Recently broke up with boyfriend who was having sex with someone else. Patient has thick white discharge, with itching and burning. No odor. Previous chlamydia infection, denies pain. Period is late.  ROS: +"ve vaginal discharge   Past Medical History  Diagnosis Date  . Ovarian cyst   . Chlamydia   . Ovarian cyst    Past Surgical History  Procedure Laterality Date  . No past surgeries     No family history on file. Social History  Substance Use Topics  . Smoking status: Former Smoker    Types: Cigarettes  . Smokeless tobacco: Never Used  . Alcohol Use: Yes     Comment: occasional   OB History    Gravida Para Term Preterm AB TAB SAB Ectopic Multiple Living   0     Review of Systems  Allergies  Review of patient's allergies indicates no known allergies.  Home Medications   Prior to Admission medications   Medication Sig Start Date End Date Taking? Authorizing Provider  fluconazole (DIFLUCAN) 150 MG tablet Take 1 tablet (150 mg total) by mouth once. May repeat in 2 days if still having symptoms. Patient not taking: Reported on 01/25/2015 07/16/14   Armando Reichert, CNM   Meds Ordered and Administered this Visit  Medications - No data to display  BP 134/88 mmHg  Pulse 72  Temp(Src) 98.1 F (36.7 C) (Oral)  Resp 16  SpO2 100% No data found.   Physical Exam  Constitutional: She is oriented to person, place, and time. She appears well-developed and well-nourished.  HENT:  Head: Normocephalic and atraumatic.  Abdominal: Soft. Bowel sounds are normal.  Genitourinary: Vaginal discharge found.  Pelvic exam is performed with patient's permission and female chaperone present. There is scant vaginal discharge  noted in the vaginal vault. Appropriate cultures were obtained. There is no adnexal tenderness noted.  Musculoskeletal: Normal range of motion.  Neurological: She is alert and oriented to person, place, and time.  Skin: Skin is warm and dry.  Psychiatric: She has a normal mood and affect. Her behavior is normal.    ED Course  Procedures (including critical care time)  Labs Review Labs Reviewed  POCT URINALYSIS DIP (DEVICE) - Abnormal; Notable for the following:    Leukocytes, UA SMALL (*)    All other components within normal limits  POCT PREGNANCY, URINE  CERVICOVAGINAL ANCILLARY ONLY    Imaging Review No results found.   Visual Acuity Review  Right Eye Distance:   Left Eye Distance:   Bilateral Distance:    Right Eye Near:   Left Eye Near:    Bilateral Near:        Review UA with patient and negative pregnancy test. MDM   1. BV (bacterial vaginosis)    Prescription for metronidazole was provided. Cultures will be back in 3 days. Patient is advised that she will be contacted by the clinic if there are any positive results.    Tharon Aquas, PA 03/30/15 2044

## 2015-03-30 NOTE — ED Notes (Signed)
Pt has been having lower abdominal and pelvic pain along with a white, thick discharge for 2-3 weeks.  She reports unprotected sex at the beginning of February.  She denies any odor or pain with urination or any fever.  Pt is concerned for pregnancy as well.

## 2015-03-31 LAB — CERVICOVAGINAL ANCILLARY ONLY: Wet Prep (BD Affirm): POSITIVE — AB

## 2015-04-01 LAB — CERVICOVAGINAL ANCILLARY ONLY
Chlamydia: NEGATIVE
Neisseria Gonorrhea: NEGATIVE

## 2015-04-04 ENCOUNTER — Telehealth (HOSPITAL_COMMUNITY): Payer: Self-pay | Admitting: Emergency Medicine

## 2015-04-04 NOTE — ED Notes (Addendum)
x1 attempt  LM on pt's VM [phone] Need to give lab results from recent visit on 2/27  Per Dr. Dayton ScrapeMurray,  Please let patient know that gonorrhea/chlamydia tests were negative. LM  Notes Recorded by Eustace MooreLaura W Murray, MD on 03/31/2015 at 1:59 PM Please let patient know that test for gardnerella (bacterial vaginosis) was positive. [facility]. Recheck for persistent symptoms. LM  Will try later.

## 2015-04-07 NOTE — ED Notes (Unsigned)
x2 attempt  LM on pt's VM [phone] Need to give lab results from recent visit on 2/27  Per Dr. Dayton ScrapeMurray,  Please let patient know that gonorrhea/chlamydia tests were negative. LM  Notes Recorded by Eustace MooreLaura W Murray, MD on 03/31/2015 at 1:59 PM Please let patient know that test for gardnerella (bacterial vaginosis) was positive. [facility] Recheck for persistent symptoms. LM  Mailed letter as x3 attempt

## 2015-07-23 ENCOUNTER — Encounter (HOSPITAL_COMMUNITY): Payer: Self-pay | Admitting: *Deleted

## 2015-07-23 ENCOUNTER — Inpatient Hospital Stay (HOSPITAL_COMMUNITY)
Admission: AD | Admit: 2015-07-23 | Discharge: 2015-07-23 | Disposition: A | Discharge status: Home / Self Care | Payer: Self-pay | Source: Ambulatory Visit | Attending: Obstetrics & Gynecology | Admitting: Obstetrics & Gynecology

## 2015-07-23 DIAGNOSIS — N739 Female pelvic inflammatory disease, unspecified: Secondary | ICD-10-CM

## 2015-07-23 DIAGNOSIS — R109 Unspecified abdominal pain: Secondary | ICD-10-CM | POA: Insufficient documentation

## 2015-07-23 DIAGNOSIS — F17200 Nicotine dependence, unspecified, uncomplicated: Secondary | ICD-10-CM | POA: Insufficient documentation

## 2015-07-23 LAB — URINALYSIS, ROUTINE W REFLEX MICROSCOPIC
BILIRUBIN URINE: NEGATIVE
Glucose, UA: NEGATIVE mg/dL
KETONES UR: NEGATIVE mg/dL
LEUKOCYTES UA: NEGATIVE
NITRITE: NEGATIVE
PH: 6 (ref 5.0–8.0)
PROTEIN: NEGATIVE mg/dL
Specific Gravity, Urine: 1.015 (ref 1.005–1.030)

## 2015-07-23 LAB — URINE MICROSCOPIC-ADD ON
Bacteria, UA: NONE SEEN
RBC / HPF: NONE SEEN RBC/hpf (ref 0–5)
WBC UA: NONE SEEN WBC/hpf (ref 0–5)

## 2015-07-23 LAB — WET PREP, GENITAL
Sperm: NONE SEEN
Trich, Wet Prep: NONE SEEN
Yeast Wet Prep HPF POC: NONE SEEN

## 2015-07-23 LAB — POCT PREGNANCY, URINE: PREG TEST UR: NEGATIVE

## 2015-07-23 MED ORDER — AZITHROMYCIN 250 MG PO TABS
1000.0000 mg | ORAL_TABLET | Freq: Once | ORAL | Status: AC
  Administered 2015-07-23: 1000 mg via ORAL
  Filled 2015-07-23: qty 4

## 2015-07-23 MED ORDER — AZITHROMYCIN 250 MG PO TABS: ORAL_TABLET | ORAL | Status: DC

## 2015-07-23 MED ORDER — CEFTRIAXONE SODIUM 250 MG IJ SOLR
250.0000 mg | INTRAMUSCULAR | Status: DC
  Administered 2015-07-23: 250 mg via INTRAMUSCULAR
  Filled 2015-07-23: qty 250

## 2015-07-23 NOTE — MAU Provider Note (Signed)
History     CSN: [id number] Arrival date and time: 07/23/15 1543 First Provider Initiated Contact with Patient 07/23/15 1612     Chief Complaint  Patient presents with  . Pelvic Pain  . Vaginal Discharge   HPI  Pain started last night. Worse with lying down, nothing alleviates it. Describes as sharp stabbing with aching. Located in the suprapubic area but diffuse across lower abdomen. Reports discharge for 1 week, white that is new. Denies vagina odor.  Last BM was last night, denies constipation and has daily stools. LMP 2-3 weeks ago. She is currently have unprotected sex with one female partner. They have been together for 4 years. She denies any known contact with someone that has been treated for GC/CT. She reports a personal history of having CT for which she was treated. Reports no pain with sexual activity. Denies dysuria or polyuria.   OB History    Gravida Para Term Preterm AB TAB SAB Ectopic Multiple Living   1    1  1    0      Past Medical History  Diagnosis Date  . Ovarian cyst   . Chlamydia   . Ovarian cyst     Past Surgical History  Procedure Laterality Date  . No past surgeries      History reviewed. No pertinent family history.  Social History  Substance Use Topics  . Smoking status: Current Some Day Smoker    Types: Cigarettes  . Smokeless tobacco: Never Used  . Alcohol Use: Yes     Comment: occasional    Allergies: No Known Allergies  Prescriptions prior to admission  Medication Sig Dispense Refill Last Dose  . metroNIDAZOLE (FLAGYL) 500 MG tablet Take 1 tablet (500 mg total) by mouth 3 (three) times daily. (Patient not taking: Reported on 07/23/2015) 14 tablet 0 Completed Course at Unknown time    Review of Systems  Constitutional: Negative for fever and chills.  Eyes: Negative for blurred vision and double vision.  Respiratory: Negative for cough and shortness of breath.   Cardiovascular: Negative for chest pain and orthopnea.   Gastrointestinal: Positive for abdominal pain. Negative for nausea, vomiting, constipation and blood in stool.  Genitourinary: Negative for dysuria, frequency and flank pain.  Musculoskeletal: Negative for myalgias.  Skin: Negative for rash.  Neurological: Negative for dizziness, tingling, weakness and headaches.  Endo/Heme/Allergies: Does not bruise/bleed easily.  Psychiatric/Behavioral: Negative for depression and suicidal ideas. The patient is not nervous/anxious.    Physical Exam   Blood pressure 131/73, pulse 71, temperature 98.8 F (37.1 C), temperature source Oral, resp. rate 16, weight 152 lb 12.8 oz (69.31 kg), last menstrual period 06/01/2015.  Physical Exam  Nursing note and vitals reviewed. Constitutional: She is oriented to person, place, and time. She appears well-developed and well-nourished. No distress.  HENT:  Head: Normocephalic and atraumatic.  Eyes: Conjunctivae are normal. No scleral icterus.  Neck: Normal range of motion. Neck supple.  Cardiovascular: Normal rate and intact distal pulses.   Respiratory: Effort normal. She exhibits no tenderness.  GI: Soft. There is no tenderness. There is no rebound and no guarding.  Genitourinary: There is no rash on the right labia. There is no rash on the left labia. Cervix exhibits motion tenderness and discharge. Cervix exhibits no friability. Vaginal discharge (thin white) found.  Very tender on cervical exam. Discharge is thin and white, not mucopurulent appearing.   Musculoskeletal: Normal range of motion. She exhibits no edema.  Neurological: She is alert and  oriented to person, place, and time.  Skin: Skin is warm and dry. No rash noted.  Psychiatric: She has a normal mood and affect.    MAU Course  Procedures  MDM  Results for orders placed or performed during the hospital encounter of 07/23/15 (from the past 24 hour(s))  Urinalysis, Routine w reflex microscopic ([facility])     Status: Abnormal   Collection  Time: 07/23/15  4:00 PM  Result Value Ref Range   Color, Urine YELLOW YELLOW   APPearance CLEAR CLEAR   Specific Gravity, Urine 1.015 1.005 - 1.030   pH 6.0 5.0 - 8.0   Glucose, UA NEGATIVE NEGATIVE mg/dL   Hgb urine dipstick TRACE (A) NEGATIVE   Bilirubin Urine NEGATIVE NEGATIVE   Ketones, ur NEGATIVE NEGATIVE mg/dL   Protein, ur NEGATIVE NEGATIVE mg/dL   Nitrite NEGATIVE NEGATIVE   Leukocytes, UA NEGATIVE NEGATIVE  Urine microscopic-add on     Status: Abnormal   Collection Time: 07/23/15  4:00 PM  Result Value Ref Range   Squamous Epithelial / LPF 0-5 (A) NONE SEEN   WBC, UA NONE SEEN 0 - 5 WBC/hpf   RBC / HPF NONE SEEN 0 - 5 RBC/hpf   Bacteria, UA NONE SEEN NONE SEEN   Urine-Other MUCOUS PRESENT   Pregnancy, urine POC     Status: None   Collection Time: 07/23/15  4:09 PM  Result Value Ref Range   Preg Test, Ur NEGATIVE NEGATIVE  Wet prep, genital     Status: Abnormal   Collection Time: 07/23/15  4:23 PM  Result Value Ref Range   Yeast Wet Prep HPF POC NONE SEEN NONE SEEN   Trich, Wet Prep NONE SEEN NONE SEEN   Clue Cells Wet Prep HPF POC PRESENT (A) NONE SEEN   WBC, Wet Prep HPF POC FEW (A) NONE SEEN   Sperm NONE SEEN      Assessment and Plan   #PID- based on new discharge with CMT and diffuse abdomen pain - CTX 250 IM - Azithromycin 1000mg  now and in 1 week  (opted for this over doxycycline given patient has no insurance and would be unable to pay 100+ dollars for doxycycline.  - Follow up for worsening sx (fevers, chills, nausea, vomiting, worsening abdominal pain - gonorrhea and chlamydia cultures collected, follow up results.  - discussed that she will be called with positive results but she can call in 3-4 days to ask for results.   Isa RankinKimberly Niles [employer] 07/23/2015, 4:12 PM

## 2015-07-23 NOTE — MAU Note (Signed)
Pain in pelvis started last night.  Has noted a vaginal d/c for about a wk

## 2015-07-23 NOTE — Discharge Instructions (Signed)

## 2015-07-24 LAB — GC/CHLAMYDIA PROBE AMP (~~LOC~~) NOT AT ARMC
CHLAMYDIA, DNA PROBE: NEGATIVE
NEISSERIA GONORRHEA: NEGATIVE

## 2015-10-29 ENCOUNTER — Encounter (HOSPITAL_COMMUNITY): Payer: Self-pay | Admitting: *Deleted

## 2015-10-29 ENCOUNTER — Inpatient Hospital Stay (HOSPITAL_COMMUNITY)
Admission: AD | Admit: 2015-10-29 | Discharge: 2015-10-29 | Disposition: A | Discharge status: Home / Self Care | Payer: Self-pay | Source: Ambulatory Visit | Attending: Obstetrics and Gynecology | Admitting: Obstetrics and Gynecology

## 2015-10-29 DIAGNOSIS — N898 Other specified noninflammatory disorders of vagina: Secondary | ICD-10-CM

## 2015-10-29 DIAGNOSIS — Z202 Contact with and (suspected) exposure to infections with a predominantly sexual mode of transmission: Secondary | ICD-10-CM | POA: Insufficient documentation

## 2015-10-29 DIAGNOSIS — F17210 Nicotine dependence, cigarettes, uncomplicated: Secondary | ICD-10-CM | POA: Insufficient documentation

## 2015-10-29 LAB — URINALYSIS, ROUTINE W REFLEX MICROSCOPIC
Bilirubin Urine: NEGATIVE
Glucose, UA: NEGATIVE mg/dL
Ketones, ur: NEGATIVE mg/dL
Leukocytes, UA: NEGATIVE
Nitrite: NEGATIVE
Protein, ur: NEGATIVE mg/dL
Specific Gravity, Urine: 1.025 (ref 1.005–1.030)
pH: 5.5 (ref 5.0–8.0)

## 2015-10-29 LAB — WET PREP, GENITAL
Clue Cells Wet Prep HPF POC: NONE SEEN
Sperm: NONE SEEN
Trich, Wet Prep: NONE SEEN
WBC, Wet Prep HPF POC: NONE SEEN
Yeast Wet Prep HPF POC: NONE SEEN

## 2015-10-29 LAB — URINE MICROSCOPIC-ADD ON

## 2015-10-29 LAB — POCT PREGNANCY, URINE: Preg Test, Ur: NEGATIVE

## 2015-10-29 MED ORDER — IBUPROFEN 800 MG PO TABS
800.0000 mg | ORAL_TABLET | Freq: Once | ORAL | Status: AC
  Administered 2015-10-29: 800 mg via ORAL
  Filled 2015-10-29: qty 1

## 2015-10-29 NOTE — MAU Provider Note (Signed)
  History     CSN: [id number]  Arrival date and time: 10/29/15 1447   None     Chief Complaint  Patient presents with  . Vaginal Discharge  . vaginal irritation   HPI: 20 yo female presents with c/o vaginal discharge and itching since Sunday. She is also late for her cycle. However it just started while she was waiting to be seen. She has a H/O STD in the past. Was treated for PID this past June due to Sx and exam but cultures were negative.  Sexual active without contraception. Same partner for 2 years.    Past Medical History:  Diagnosis Date  . Chlamydia   . Ovarian cyst   . Ovarian cyst     Past Surgical History:  Procedure Laterality Date  . NO PAST SURGERIES      Family History  Problem Relation Age of Onset  . Cancer Neg Hx   . Diabetes Neg Hx   . Hypertension Neg Hx     Social History  Substance Use Topics  . Smoking status: Current Some Day Smoker    Types: Cigarettes  . Smokeless tobacco: Never Used  . Alcohol use Yes     Comment: occasional    Allergies: No Known Allergies  Prescriptions Prior to Admission  Medication Sig Dispense Refill Last Dose  . azithromycin (ZITHROMAX) 250 MG tablet Take 4 pills (1000mg ) in 1 week 4 each 0     Review of Systems  Constitutional: Negative.   HENT: Negative.   Respiratory: Negative.   Cardiovascular: Negative.   Gastrointestinal: Negative.   Genitourinary: Negative.    Physical Exam   Blood pressure 129/93, pulse 75, temperature 98.8 F (37.1 C), temperature source Oral, resp. rate 18, height 5\' 5"  (1.651 m), weight 67.6 kg (149 lb), last menstrual period 10/29/2015.  Physical Exam  Constitutional: She appears well-developed and well-nourished.  Cardiovascular: Normal rate and regular rhythm.   Respiratory: Effort normal and breath sounds normal.  GI: Soft. Bowel sounds are normal.  Genitourinary:  Genitourinary Comments: Nl EGBUS Menses noted, no discharge  Uterus small mobile, non tender, no  adnexal masses or tenderness    MAU Course  Procedures Wet prep obtain and was negative GC/C obtained   Assessment and Plan  STD exposure  Pt reassured no evidence of infection via wet prep. Will be contact regarding GC/C results.  Hermina StaggersMichael L Ilyaas Musto 10/29/2015, 4:36 PM

## 2015-10-29 NOTE — MAU Note (Signed)
Pt informed registration staff that her period just started.

## 2015-10-29 NOTE — MAU Note (Signed)
Pt states she is late for her period, also having vaginal itching & irritation.  Has white thick discharge, has odor.  Denies pain or dysuria.  No bleeding.

## 2015-10-30 LAB — GC/CHLAMYDIA PROBE AMP (~~LOC~~) NOT AT ARMC
CHLAMYDIA, DNA PROBE: NEGATIVE
NEISSERIA GONORRHEA: NEGATIVE

## 2016-04-26 ENCOUNTER — Encounter: Payer: Self-pay | Admitting: Family Medicine

## 2016-12-14 ENCOUNTER — Inpatient Hospital Stay (HOSPITAL_COMMUNITY)
Admission: AD | Admit: 2016-12-14 | Discharge: 2016-12-14 | Disposition: A | Discharge status: Home / Self Care | Payer: Self-pay | Source: Ambulatory Visit | Attending: Obstetrics and Gynecology | Admitting: Obstetrics and Gynecology

## 2016-12-14 ENCOUNTER — Encounter (HOSPITAL_COMMUNITY): Payer: Self-pay | Admitting: *Deleted

## 2016-12-14 DIAGNOSIS — Z3202 Encounter for pregnancy test, result negative: Secondary | ICD-10-CM | POA: Insufficient documentation

## 2016-12-14 DIAGNOSIS — R102 Pelvic and perineal pain: Secondary | ICD-10-CM | POA: Insufficient documentation

## 2016-12-14 DIAGNOSIS — F17210 Nicotine dependence, cigarettes, uncomplicated: Secondary | ICD-10-CM | POA: Insufficient documentation

## 2016-12-14 LAB — COMPREHENSIVE METABOLIC PANEL
ALBUMIN: 4.2 g/dL (ref 3.5–5.0)
ALK PHOS: 77 U/L (ref 38–126)
ALT: 12 U/L — AB (ref 14–54)
AST: 17 U/L (ref 15–41)
Anion gap: 5 (ref 5–15)
BILIRUBIN TOTAL: 0.4 mg/dL (ref 0.3–1.2)
BUN: 16 mg/dL (ref 6–20)
CALCIUM: 8.8 mg/dL — AB (ref 8.9–10.3)
CO2: 26 mmol/L (ref 22–32)
CREATININE: 0.9 mg/dL (ref 0.44–1.00)
Chloride: 108 mmol/L (ref 101–111)
GFR calc Af Amer: >60 mL/min (ref >60)
GFR calc non Af Amer: >60 mL/min (ref >60)
GLUCOSE: 111 mg/dL — AB (ref 65–99)
Potassium: 3.9 mmol/L (ref 3.5–5.1)
SODIUM: 139 mmol/L (ref 135–145)
TOTAL PROTEIN: 7.1 g/dL (ref 6.5–8.1)

## 2016-12-14 LAB — URINALYSIS, ROUTINE W REFLEX MICROSCOPIC
Bilirubin Urine: NEGATIVE
GLUCOSE, UA: NEGATIVE mg/dL
HGB URINE DIPSTICK: NEGATIVE
KETONES UR: NEGATIVE mg/dL
NITRITE: NEGATIVE
PROTEIN: NEGATIVE mg/dL
Specific Gravity, Urine: 1.026 (ref 1.005–1.030)
pH: 7 (ref 5.0–8.0)

## 2016-12-14 LAB — WET PREP, GENITAL
CLUE CELLS WET PREP: NONE SEEN
Sperm: NONE SEEN
Trich, Wet Prep: NONE SEEN
Yeast Wet Prep HPF POC: NONE SEEN

## 2016-12-14 LAB — CBC
HCT: 37.5 % (ref 36.0–46.0)
Hemoglobin: 12.6 g/dL (ref 12.0–15.0)
MCH: 30.9 pg (ref 26.0–34.0)
MCHC: 33.6 g/dL (ref 30.0–36.0)
MCV: 91.9 fL (ref 78.0–100.0)
Platelets: 260 10*3/uL (ref 150–400)
RBC: 4.08 MIL/uL (ref 3.87–5.11)
RDW: 13.1 % (ref 11.5–15.5)
WBC: 5.4 10*3/uL (ref 4.0–10.5)

## 2016-12-14 LAB — POCT PREGNANCY, URINE: Preg Test, Ur: NEGATIVE

## 2016-12-14 MED ORDER — KETOROLAC TROMETHAMINE 60 MG/2ML IM SOLN
60.0000 mg | INTRAMUSCULAR | Status: AC
  Administered 2016-12-14: 60 mg via INTRAMUSCULAR
  Filled 2016-12-14: qty 2

## 2016-12-14 NOTE — MAU Provider Note (Signed)
History     CSN: [id number]  Arrival date and time: 12/14/16 1613   First Provider Initiated Contact with Patient 12/14/16 1708      Chief Complaint  Patient presents with  . Abdominal Pain  . Pelvic Pain  . Possible Pregnancy  . Vaginal Pain   HPI  Ms. Morgan Kirk is a non-pregnant female presenting to MAU with complaints of pelvic pain, LT side vaginal pain, bilateral breast tenderness and abdominal cramping x 1.5 wks. Denies VB. Patient reports she just found her boyfriend has been cheating on her. She is requesting to be tested for STIs.   Past Medical History:  Diagnosis Date  . Chlamydia   . Ovarian cyst   . Ovarian cyst     Past Surgical History:  Procedure Laterality Date  . NO PAST SURGERIES      Family History  Problem Relation Age of Onset  . Cancer Neg Hx   . Diabetes Neg Hx   . Hypertension Neg Hx     Social History   Tobacco Use  . Smoking status: Current Some Day Smoker    Types: Cigarettes  . Smokeless tobacco: Never Used  Substance Use Topics  . Alcohol use: Yes    Comment: occasional  . Drug use: Yes    Types: Marijuana    Comment: 3 da;ys ago    Allergies: No Known Allergies  No medications prior to admission.    Review of Systems  Constitutional: Negative.   HENT: Negative.   Eyes: Negative.   Respiratory: Negative.   Cardiovascular: Negative.   Gastrointestinal: Positive for abdominal pain.  Endocrine: Negative.   Genitourinary: Positive for pelvic pain and vaginal pain.  Musculoskeletal: Negative.   Skin: Negative.   Allergic/Immunologic: Negative.   Neurological: Negative.   Hematological: Negative.   Psychiatric/Behavioral: Negative.    Physical Exam   Blood pressure 128/86, pulse 84, temperature 98.4 F (36.9 C), temperature source Oral, resp. rate 16, height 5\' 5"  (1.651 m), weight 167 lb (75.8 kg), last menstrual period 08/31/2016, SpO2 99 %.  Physical Exam  Nursing note and vitals  reviewed. Constitutional: She is oriented to person, place, and time. She appears well-developed and well-nourished.  HENT:  Head: Normocephalic.  Eyes: Pupils are equal, round, and reactive to light.  Neck: Normal range of motion.  Cardiovascular: Normal rate, regular rhythm and normal heart sounds.  Respiratory: Effort normal and breath sounds normal.  GI: Soft. Bowel sounds are normal.  Genitourinary:  Genitourinary Comments: Uterus: non-tender, cx: smooth, pink, no lesions, small amt of thin, white vaginal d/c, closed/long/firm, no CMT or friability, no adnexal tenderness   Musculoskeletal: Normal range of motion.  Neurological: She is alert and oriented to person, place, and time.  Skin: Skin is warm and dry.  Psychiatric: She has a normal mood and affect. Her behavior is normal. Judgment and thought content normal.    MAU Course  Procedures  MDM CCUA UPT Wet Prep GC/CT HIV Hep C RPR  Results for orders placed or performed during the hospital encounter of 12/14/16 (from the past 24 hour(s))  Urinalysis, Routine w reflex microscopic     Status: Abnormal   Collection Time: 12/14/16  4:29 PM  Result Value Ref Range   Color, Urine YELLOW YELLOW   APPearance HAZY (A) CLEAR   Specific Gravity, Urine 1.026 1.005 - 1.030   pH 7.0 5.0 - 8.0   Glucose, UA NEGATIVE NEGATIVE mg/dL   Hgb urine dipstick NEGATIVE NEGATIVE  Bilirubin Urine NEGATIVE NEGATIVE   Ketones, ur NEGATIVE NEGATIVE mg/dL   Protein, ur NEGATIVE NEGATIVE mg/dL   Nitrite NEGATIVE NEGATIVE   Leukocytes, UA TRACE (A) NEGATIVE   RBC / HPF 0-5 0 - 5 RBC/hpf   WBC, UA 0-5 0 - 5 WBC/hpf   Bacteria, UA RARE (A) NONE SEEN   Squamous Epithelial / LPF 0-5 (A) NONE SEEN   Mucus PRESENT   Pregnancy, urine POC     Status: None   Collection Time: 12/14/16  4:39 PM  Result Value Ref Range   Preg Test, Ur NEGATIVE NEGATIVE  CBC     Status: None   Collection Time: 12/14/16  4:48 PM  Result Value Ref Range   WBC 5.4  4.0 - 10.5 K/uL   RBC 4.08 3.87 - 5.11 MIL/uL   Hemoglobin 12.6 12.0 - 15.0 g/dL   HCT 44.037.5 10.236.0 - 72.546.0 %   MCV 91.9 78.0 - 100.0 fL   MCH 30.9 26.0 - 34.0 pg   MCHC 33.6 30.0 - 36.0 g/dL   RDW 36.613.1 44.011.5 - 34.715.5 %   Platelets 260 150 - 400 K/uL  Comprehensive metabolic panel     Status: Abnormal   Collection Time: 12/14/16  4:48 PM  Result Value Ref Range   Sodium 139 135 - 145 mmol/L   Potassium 3.9 3.5 - 5.1 mmol/L   Chloride 108 101 - 111 mmol/L   CO2 26 22 - 32 mmol/L   Glucose, Bld 111 (H) 65 - 99 mg/dL   BUN 16 6 - 20 mg/dL   Creatinine, Ser 4.250.90 0.44 - 1.00 mg/dL   Calcium 8.8 (L) 8.9 - 10.3 mg/dL   Total Protein 7.1 6.5 - 8.1 g/dL   Albumin 4.2 3.5 - 5.0 g/dL   AST 17 15 - 41 U/L   ALT 12 (L) 14 - 54 U/L   Alkaline Phosphatase 77 38 - 126 U/L   Total Bilirubin 0.4 0.3 - 1.2 mg/dL   GFR calc non Af Amer >60 >60 mL/min   GFR calc Af Amer >60 >60 mL/min   Anion gap 5 5 - 15  Wet prep, genital     Status: Abnormal   Collection Time: 12/14/16  5:14 PM  Result Value Ref Range   Yeast Wet Prep HPF POC NONE SEEN NONE SEEN   Trich, Wet Prep NONE SEEN NONE SEEN   Clue Cells Wet Prep HPF POC NONE SEEN NONE SEEN   WBC, Wet Prep HPF POC FEW (A) NONE SEEN   Sperm NONE SEEN      Assessment and Plan  Pelvic pain - Advised to take Tylenol 100 mg po every 6 hrs or ibuprofen 600 mg every 6 hrs prn pain. - Information provided on Abd pain, pelvic pain, STIs, and safe sex  Discharge home  Patient verbalized an understanding of the plan of care and agrees.   Raelyn Moraolitta Corky Blumstein, MSN, CNM 12/14/2016, 5:08 PM

## 2016-12-14 NOTE — MAU Note (Signed)
Pt presents with c/o pelvic pain, vaginal discomfort, bilateral breast tenderness, abdominal cramping.  Reports symptoms have been present x 1.5 weeks.  Denies VB.  Neg UPT.

## 2016-12-14 NOTE — MAU Note (Signed)
Pt reports she has not had a period in 2 months, pelvic pain, vaginal pain, lower abd pain. Pain present for 1 week.

## 2016-12-15 LAB — HIV ANTIBODY (ROUTINE TESTING W REFLEX): HIV Screen 4th Generation wRfx: NONREACTIVE

## 2016-12-15 LAB — RPR, QUANT+TP ABS (REFLEX): TREPONEMA PALLIDUM AB: POSITIVE — AB

## 2016-12-15 LAB — RPR: RPR Ser Ql: REACTIVE — AB

## 2016-12-15 LAB — HEPATITIS C ANTIBODY

## 2016-12-15 LAB — GC/CHLAMYDIA PROBE AMP (~~LOC~~) NOT AT ARMC
Chlamydia: POSITIVE — AB
Neisseria Gonorrhea: NEGATIVE

## 2016-12-16 ENCOUNTER — Telehealth: Payer: Self-pay | Admitting: Medical

## 2016-12-16 ENCOUNTER — Telehealth: Payer: Self-pay | Admitting: Advanced Practice Midwife

## 2016-12-16 DIAGNOSIS — A749 Chlamydial infection, unspecified: Secondary | ICD-10-CM

## 2016-12-16 MED ORDER — AZITHROMYCIN 250 MG PO TABS: 1000.0000 mg | ORAL_TABLET | Freq: Once | ORAL | 0 refills | Status: AC

## 2016-12-16 NOTE — Telephone Encounter (Signed)
Patient called and verified with name, DOB and address. Patient informed that +RPR and confirmatory testing. She states that she has never had syphilis in the past. Advised patient that [facility] Gave phone number [phone]) for her to call to schedule appt for treatment. She agrees with plan. She was also notified about +chalmydia already today, and plans to get RX today for that.   All questions answered.  Morgan Kirk 4:19 PM 12/16/16

## 2016-12-16 NOTE — Telephone Encounter (Addendum)
Averi R Riel tested positive for  Chlamydia. Patient was called by RN and allergies and pharmacy confirmed. Rx sent to pharmacy of choice.   Marny LowensteinWenzel, Koi Zangara N, PA-C 12/16/2016 11:42 AM      ----- Message from Kathe BectonLori S Berdik, RN sent at 12/16/2016 10:59 AM EST ----- This patient tested positive for : chlamydia  She "has NKDA", I have informed the patient of her results and confirmed her pharmacy is correct in her chart. Please send Rx.   Thank you,   Kathe BectonBerdik, Lori S, RN   [facility]

## 2016-12-20 ENCOUNTER — Other Ambulatory Visit: Payer: Self-pay | Admitting: Advanced Practice Midwife

## 2016-12-20 MED ORDER — AZITHROMYCIN 250 MG PO TABS: 1000.0000 mg | ORAL_TABLET | Freq: Once | ORAL | 0 refills | Status: AC

## 2016-12-20 MED ORDER — PROMETHAZINE HCL 25 MG PO TABS: 12.5000 mg | ORAL_TABLET | Freq: Four times a day (QID) | ORAL | 0 refills | Status: DC | PRN

## 2016-12-20 NOTE — Progress Notes (Signed)
Patient call the unit today, and states that she threw up right after taking azithromycin for chlamydia. She would like another dose and something for nausea to take with it to prevent vomiting. Will send in rx to pharmacy.

## 2017-02-08 ENCOUNTER — Other Ambulatory Visit: Payer: Self-pay

## 2017-02-08 ENCOUNTER — Encounter (HOSPITAL_COMMUNITY): Payer: Self-pay

## 2017-02-08 ENCOUNTER — Emergency Department (HOSPITAL_COMMUNITY)
Admission: EM | Admit: 2017-02-08 | Discharge: 2017-02-08 | Disposition: A | Discharge status: Home / Self Care | Payer: Self-pay | Attending: Emergency Medicine | Admitting: Emergency Medicine

## 2017-02-08 ENCOUNTER — Emergency Department (HOSPITAL_COMMUNITY): Payer: Self-pay

## 2017-02-08 DIAGNOSIS — Z5321 Procedure and treatment not carried out due to patient leaving prior to being seen by health care provider: Secondary | ICD-10-CM | POA: Insufficient documentation

## 2017-02-08 DIAGNOSIS — M25561 Pain in right knee: Secondary | ICD-10-CM | POA: Insufficient documentation

## 2017-02-08 LAB — POC URINE PREG, ED: Preg Test, Ur: NEGATIVE
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
## 2017-02-17 ENCOUNTER — Emergency Department (HOSPITAL_COMMUNITY)
Admission: EM | Admit: 2017-02-17 | Discharge: 2017-02-17 | Disposition: A | Discharge status: Home / Self Care | Payer: No Typology Code available for payment source | Attending: Emergency Medicine | Admitting: Emergency Medicine

## 2017-02-17 ENCOUNTER — Other Ambulatory Visit: Payer: Self-pay

## 2017-02-17 ENCOUNTER — Emergency Department (HOSPITAL_COMMUNITY): Payer: No Typology Code available for payment source

## 2017-02-17 ENCOUNTER — Encounter (HOSPITAL_COMMUNITY): Payer: Self-pay | Admitting: Emergency Medicine

## 2017-02-17 DIAGNOSIS — M545 Low back pain, unspecified: Secondary | ICD-10-CM

## 2017-02-17 DIAGNOSIS — S8391XA Sprain of unspecified site of right knee, initial encounter: Secondary | ICD-10-CM | POA: Insufficient documentation

## 2017-02-17 DIAGNOSIS — M62830 Muscle spasm of back: Secondary | ICD-10-CM | POA: Insufficient documentation

## 2017-02-17 DIAGNOSIS — Y92411 Interstate highway as the place of occurrence of the external cause: Secondary | ICD-10-CM | POA: Diagnosis not present

## 2017-02-17 DIAGNOSIS — S8001XA Contusion of right knee, initial encounter: Secondary | ICD-10-CM | POA: Diagnosis not present

## 2017-02-17 DIAGNOSIS — Y998 Other external cause status: Secondary | ICD-10-CM | POA: Insufficient documentation

## 2017-02-17 DIAGNOSIS — F17210 Nicotine dependence, cigarettes, uncomplicated: Secondary | ICD-10-CM | POA: Insufficient documentation

## 2017-02-17 DIAGNOSIS — Y939 Activity, unspecified: Secondary | ICD-10-CM | POA: Insufficient documentation

## 2017-02-17 DIAGNOSIS — S8991XA Unspecified injury of right lower leg, initial encounter: Secondary | ICD-10-CM | POA: Diagnosis present

## 2017-02-17 MED ORDER — HYDROCODONE-ACETAMINOPHEN 5-325 MG PO TABS
1.0000 | ORAL_TABLET | Freq: Once | ORAL | Status: AC
Start: 2017-02-17 — End: 2017-02-17
  Administered 2017-02-17: 1 via ORAL
  Filled 2017-02-17: qty 1

## 2017-02-17 MED ORDER — NAPROXEN 500 MG PO TABS: 500.0000 mg | ORAL_TABLET | Freq: Two times a day (BID) | ORAL | 0 refills | Status: DC | PRN

## 2017-02-17 MED ORDER — CYCLOBENZAPRINE HCL 10 MG PO TABS: 10.0000 mg | ORAL_TABLET | Freq: Three times a day (TID) | ORAL | 0 refills | Status: DC | PRN
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
## 2017-03-06 ENCOUNTER — Ambulatory Visit (INDEPENDENT_AMBULATORY_CARE_PROVIDER_SITE_OTHER): Payer: Self-pay | Admitting: Orthopaedic Surgery

## 2017-04-14 ENCOUNTER — Inpatient Hospital Stay (HOSPITAL_COMMUNITY)
Admission: AD | Admit: 2017-04-14 | Discharge: 2017-04-14 | Disposition: A | Discharge status: Home / Self Care | Payer: Self-pay | Source: Ambulatory Visit | Attending: Obstetrics & Gynecology | Admitting: Obstetrics & Gynecology

## 2017-04-14 ENCOUNTER — Other Ambulatory Visit: Payer: Self-pay

## 2017-04-14 ENCOUNTER — Encounter (HOSPITAL_COMMUNITY): Payer: Self-pay | Admitting: *Deleted

## 2017-04-14 DIAGNOSIS — Z3202 Encounter for pregnancy test, result negative: Secondary | ICD-10-CM | POA: Insufficient documentation

## 2017-04-14 DIAGNOSIS — R109 Unspecified abdominal pain: Secondary | ICD-10-CM | POA: Insufficient documentation

## 2017-04-14 DIAGNOSIS — R1030 Lower abdominal pain, unspecified: Secondary | ICD-10-CM

## 2017-04-14 DIAGNOSIS — N898 Other specified noninflammatory disorders of vagina: Secondary | ICD-10-CM

## 2017-04-14 DIAGNOSIS — Z8619 Personal history of other infectious and parasitic diseases: Secondary | ICD-10-CM | POA: Insufficient documentation

## 2017-04-14 DIAGNOSIS — Z7689 Persons encountering health services in other specified circumstances: Secondary | ICD-10-CM

## 2017-04-14 DIAGNOSIS — Z202 Contact with and (suspected) exposure to infections with a predominantly sexual mode of transmission: Secondary | ICD-10-CM | POA: Insufficient documentation

## 2017-04-14 DIAGNOSIS — F17210 Nicotine dependence, cigarettes, uncomplicated: Secondary | ICD-10-CM | POA: Insufficient documentation

## 2017-04-14 LAB — CBC
HCT: 37.8 % (ref 36.0–46.0)
Hemoglobin: 12.9 g/dL (ref 12.0–15.0)
MCH: 30.4 pg (ref 26.0–34.0)
MCHC: 34.1 g/dL (ref 30.0–36.0)
MCV: 88.9 fL (ref 78.0–100.0)
Platelets: 282 10*3/uL (ref 150–400)
RBC: 4.25 MIL/uL (ref 3.87–5.11)
RDW: 13 % (ref 11.5–15.5)
WBC: 4.9 10*3/uL (ref 4.0–10.5)

## 2017-04-14 LAB — WET PREP, GENITAL
Clue Cells Wet Prep HPF POC: NONE SEEN
Sperm: NONE SEEN
Trich, Wet Prep: NONE SEEN
Yeast Wet Prep HPF POC: NONE SEEN

## 2017-04-14 LAB — URINALYSIS, ROUTINE W REFLEX MICROSCOPIC
BILIRUBIN URINE: NEGATIVE
GLUCOSE, UA: NEGATIVE mg/dL
HGB URINE DIPSTICK: NEGATIVE
Ketones, ur: NEGATIVE mg/dL
Leukocytes, UA: NEGATIVE
Nitrite: NEGATIVE
PH: 7 (ref 5.0–8.0)
Protein, ur: NEGATIVE mg/dL
SPECIFIC GRAVITY, URINE: 1.016 (ref 1.005–1.030)

## 2017-04-14 LAB — POCT PREGNANCY, URINE: Preg Test, Ur: NEGATIVE

## 2017-04-14 MED ORDER — KETOROLAC TROMETHAMINE 60 MG/2ML IM SOLN: 60.0000 mg | Freq: Once | INTRAMUSCULAR | Status: DC

## 2017-04-14 NOTE — MAU Note (Signed)
Pt states she has not had a period since January. Also reports a strong vaginal odor. Has had lower abd pain off/on

## 2017-04-14 NOTE — Discharge Instructions (Signed)
In late 2019, the [facility] will be moving to the [place] campus. At that time, the MAU (Maternity Admissions Unit), where you are being seen today, will no longer take care of non-pregnant patients. We strongly encourage you to find a doctor's office before that time, so that you can be seen with any GYN concerns, like vaginal discharge, urinary tract infection, etc.. in a timely manner. ° °In order to make an office visit more convenient, the [facility] will be offering evening hours with same-day appointments, walk-in appointments and scheduled appointments available during this time. ° °[facility] Hours: °Monday - 8am - 7:30 pm with walk-in between 4pm- 7:30 pm °Tuesday - 8 am - 5 pm (starting 05/02/17 we will be open late and accepting walk-ins from 4pm - 7:30pm) °Wednesday - 8 am - 5 pm (starting 08/02/17 we will be open late and accepting walk-ins from 4pm - 7:30pm) °Thursday 8 am - 5 pm (starting 11/02/17 we will be open late and accepting walk-ins from 4pm - 7:30pm) °Friday 8 am - 5 pm ° °For an appointment please call the [facility] at [phone] ° °For urgent needs, [facility] is also available for management of urgent GYN complaints such as vaginal discharge or urinary tract infections. ° ° ° ° ° °
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
## 2017-04-15 LAB — HIV ANTIBODY (ROUTINE TESTING W REFLEX): HIV Screen 4th Generation wRfx: NONREACTIVE

## 2017-04-16 LAB — RPR: RPR Ser Ql: REACTIVE — AB

## 2017-04-16 LAB — HSV CULTURE AND TYPING

## 2017-04-16 LAB — RPR, QUANT+TP ABS (REFLEX)
Rapid Plasma Reagin, Quant: 1:2 {titer} — ABNORMAL HIGH
T Pallidum Abs: POSITIVE — AB

## 2017-04-17 ENCOUNTER — Telehealth: Payer: Self-pay | Admitting: Obstetrics and Gynecology

## 2017-04-17 LAB — GC/CHLAMYDIA PROBE AMP (~~LOC~~) NOT AT ARMC
Chlamydia: NEGATIVE
Neisseria Gonorrhea: NEGATIVE

## 2017-04-17 NOTE — Telephone Encounter (Signed)
TC to patient to determine if tx was received with syphilis dx in 12/2016. [facility] "did not have syphilis and that she did not need treatment again." She states that she received tx for syphilis in 2014.  Notified of other STI tests results that were completed at the time of the call. Advised to call the [facility] to see if she will need to come for tx after this (+) syphilis testing. Patient states she will call the [facility] to ask.  Raelyn MoraRolitta Hershell Brandl, CNM  04/17/2017 10:34 AM

## 2017-06-20 ENCOUNTER — Emergency Department (HOSPITAL_COMMUNITY)
Admission: EM | Admit: 2017-06-20 | Discharge: 2017-06-21 | Disposition: A | Discharge status: Home / Self Care | Payer: Self-pay | Attending: Emergency Medicine | Admitting: Emergency Medicine

## 2017-06-20 ENCOUNTER — Emergency Department (HOSPITAL_COMMUNITY): Payer: Self-pay

## 2017-06-20 ENCOUNTER — Encounter (HOSPITAL_COMMUNITY): Payer: Self-pay | Admitting: Emergency Medicine

## 2017-06-20 DIAGNOSIS — W19XXXA Unspecified fall, initial encounter: Secondary | ICD-10-CM

## 2017-06-20 DIAGNOSIS — S42142A Displaced fracture of glenoid cavity of scapula, left shoulder, initial encounter for closed fracture: Secondary | ICD-10-CM

## 2017-06-20 DIAGNOSIS — F17210 Nicotine dependence, cigarettes, uncomplicated: Secondary | ICD-10-CM | POA: Insufficient documentation

## 2017-06-20 DIAGNOSIS — W108XXA Fall (on) (from) other stairs and steps, initial encounter: Secondary | ICD-10-CM | POA: Insufficient documentation

## 2017-06-20 DIAGNOSIS — Y9289 Other specified places as the place of occurrence of the external cause: Secondary | ICD-10-CM | POA: Insufficient documentation

## 2017-06-20 DIAGNOSIS — R200 Anesthesia of skin: Secondary | ICD-10-CM | POA: Insufficient documentation

## 2017-06-20 DIAGNOSIS — F1210 Cannabis abuse, uncomplicated: Secondary | ICD-10-CM | POA: Insufficient documentation

## 2017-06-20 DIAGNOSIS — S42152A Displaced fracture of neck of scapula, left shoulder, initial encounter for closed fracture: Secondary | ICD-10-CM | POA: Insufficient documentation

## 2017-06-20 DIAGNOSIS — Y9301 Activity, walking, marching and hiking: Secondary | ICD-10-CM | POA: Insufficient documentation

## 2017-06-20 DIAGNOSIS — S43005A Unspecified dislocation of left shoulder joint, initial encounter: Secondary | ICD-10-CM

## 2017-06-20 DIAGNOSIS — Y998 Other external cause status: Secondary | ICD-10-CM | POA: Insufficient documentation

## 2017-06-20 LAB — I-STAT BETA HCG BLOOD, ED (MC, WL, AP ONLY): I-stat hCG, quantitative: <5 m[IU]/mL (ref <5)

## 2017-06-20 MED ORDER — KETAMINE HCL 10 MG/ML IJ SOLN
1.0000 mg/kg | Freq: Once | INTRAMUSCULAR | Status: DC
Start: 2017-06-20 — End: 2017-06-21

## 2017-06-20 MED ORDER — PROPOFOL 10 MG/ML IV BOLUS
1.0000 mg/kg | Freq: Once | INTRAVENOUS | Status: DC
  Filled 2017-06-20: qty 20

## 2017-06-20 MED ORDER — PROPOFOL 10 MG/ML IV BOLUS
INTRAVENOUS | Status: AC | PRN
  Administered 2017-06-20: 40 mg via INTRAVENOUS

## 2017-06-20 MED ORDER — ONDANSETRON HCL 4 MG/2ML IJ SOLN
4.0000 mg | Freq: Once | INTRAMUSCULAR | Status: AC
  Administered 2017-06-20: 4 mg via INTRAVENOUS
  Filled 2017-06-20: qty 2

## 2017-06-20 MED ORDER — KETAMINE HCL 10 MG/ML IJ SOLN
INTRAMUSCULAR | Status: AC | PRN
  Administered 2017-06-20: 80 mg via INTRAVENOUS

## 2017-06-20 MED ORDER — KETAMINE HCL 10 MG/ML IJ SOLN
INTRAMUSCULAR | Status: AC
  Filled 2017-06-20: qty 1

## 2017-06-20 MED ORDER — OXYCODONE-ACETAMINOPHEN 5-325 MG PO TABS
1.0000 | ORAL_TABLET | ORAL | Status: DC | PRN
  Administered 2017-06-20: 1 via ORAL
  Filled 2017-06-20: qty 1

## 2017-06-20 MED ORDER — SODIUM CHLORIDE 0.9 % IV BOLUS
1000.0000 mL | Freq: Once | INTRAVENOUS | Status: AC
  Administered 2017-06-20: 1000 mL via INTRAVENOUS

## 2017-06-20 NOTE — ED Triage Notes (Addendum)
Pt states she had a mechanical fall down 12 stairs. Did hit her head. Pain to left shoulder, states decreased ROM. Pulses heard by doppler.

## 2017-06-20 NOTE — ED Notes (Signed)
Paper consent signed and placed in medical records 

## 2017-06-20 NOTE — ED Provider Notes (Addendum)
Patient placed in Quick Look pathway, seen and evaluated   Chief Complaint: Arm Pain   HPI:   22 y.o. who presents for evaluation of right upper extremity pain after mechanical fall down approximately 12 steps that occurred today.  Patient reports that she tripped.  Patient states that she hit her head on the left side.  She states she did not have any LOC.  Patient denies any neck pain.  She is not on any blood thinners.  Patient reports that she landed on her right shoulder.  Patient reports pain and limited range of motion right upper extremity since then.  Patient reports she is also having numbness of the right extremity.  Patient reports that she can move her fingers but states that it hurts.  Patient denies any vision changes, neck pain, vomiting.  ROS: RUE pain  Physical Exam:   Gen: No distress  Neuro: Awake and Alert  Skin: Warm  Neck: Full flexion/extension and lateral movement of neck fully intact. No bony midline tenderness. No deformities or crepitus.     Focused Exam: Tenderness palpation noted to right shoulder, right humerus, right elbow, right forearm, right wrist.  Soft tissue swelling noted at the right proximal humerus.  Unable to assess range of motion secondary to patient's inability to cooperate with exam secondary to pain.  Can slightly flex the elbow but must stop due to ability of patient to tolerate movement.  She can wiggle her fingers.  Initially did not feel a palpable pulse but Doppler confirmed good radial and ulnar pulses.  Good distal cap refill. RUE is not dusky in appearance or cool to touch.    Initiation of care has begun. The patient has been counseled on the process, plan, and necessity for staying for the completion/evaluation, and the remainder of the medical screening examination    Maxwell Caul, PA-C 06/20/17 1939    Maxwell Caul, PA-C 06/20/17 1943    Nira Conn, MD 06/21/17 2053

## 2017-06-20 NOTE — ED Provider Notes (Signed)
[facility] EMERGENCY DEPARTMENT Provider Note   CSN: [id number] Arrival date & time: 06/20/17  1924    History   Chief Complaint Chief Complaint  Patient presents with  . Arm Injury    HPI Morgan Kirk is a 22 y.o. female.  Pt reports she had mechanical fall after she tripped over an untied shoe going down the steps. Landed on her left shoulder. Hit head but had no LOC. Denies any injury except her left shoulder. No neck or back pain. No abd pain.   The history is provided by the patient.  Arm Injury   This is a new problem. The current episode started 3 to 5 hours ago. The problem occurs constantly. The problem has not changed since onset.The pain is present in the left shoulder. The quality of the pain is described as aching. The pain is severe. Associated symptoms include numbness. Treatments tried: PO narcotic. The treatment provided mild relief. There has been a history of trauma.    Past Medical History:  Diagnosis Date  . Chlamydia   . Ovarian cyst   . Ovarian cyst     Patient Active Problem List   Diagnosis Date Noted  . Pelvic pain 12/14/2016    Past Surgical History:  Procedure Laterality Date  . NO PAST SURGERIES       OB History    Gravida  0   Para      Term      Preterm      AB  0   Living  0     SAB  0   TAB      Ectopic      Multiple      Live Births               Home Medications    Prior to Admission medications   Medication Sig Start Date End Date Taking? Authorizing Provider  cyclobenzaprine (FLEXERIL) 10 MG tablet Take 1 tablet (10 mg total) by mouth 3 (three) times daily as needed for muscle spasms. Patient not taking: Reported on 06/20/2017 02/17/17   Street, [place], PA-C  morphine (MSIR) 15 MG tablet Take 1 tablet (15 mg total) by mouth every 6 (six) hours as needed for severe pain. 06/21/17   Lennette Bihari, MD  naproxen (NAPROSYN) 500 MG tablet Take 1 tablet (500 mg total) by mouth 2  (two) times daily as needed for mild pain, moderate pain or headache (TAKE WITH MEALS.). Patient not taking: Reported on 06/20/2017 02/17/17   Street, [place], PA-C    Family History Family History  Problem Relation Age of Onset  . Cancer Neg Hx   . Diabetes Neg Hx   . Hypertension Neg Hx     Social History Social History   Tobacco Use  . Smoking status: Current Some Day Smoker    Types: Cigarettes  . Smokeless tobacco: Never Used  Substance Use Topics  . Alcohol use: Yes    Comment: occasional  . Drug use: Yes    Types: Marijuana    Comment: 3 da;ys ago     Allergies   Patient has no known allergies.   Review of Systems Review of Systems  Constitutional: Negative for chills and fever.  HENT: Negative for ear pain and sore throat.   Eyes: Negative for pain and visual disturbance.  Respiratory: Negative for cough and shortness of breath.   Cardiovascular: Negative for chest pain and palpitations.  Gastrointestinal: Negative for abdominal  pain and vomiting.  Genitourinary: Negative for dysuria and hematuria.  Musculoskeletal: Positive for arthralgias. Negative for back pain and neck pain.  Skin: Negative for color change and rash.  Neurological: Positive for numbness. Negative for seizures and syncope.  All other systems reviewed and are negative.    Physical Exam Updated Vital Signs BP (!) 189/145   Pulse 94   Temp 98 F (36.7 C) (Oral)   Resp 19   Ht  (1.651 m)   Wt 79.8 kg (176 lb)   LMP 06/18/2017   SpO2 99%   BMI 29.29 kg/m   Physical Exam  Constitutional: She is oriented to person, place, and time. She appears well-developed and well-nourished. No distress.  HENT:  Head: Normocephalic and atraumatic.  Eyes: Conjunctivae are normal.  Neck: Normal range of motion. Neck supple.  No cervical spine tenderness.  Cardiovascular: Normal rate and regular rhythm.  No murmur heard. Pulmonary/Chest: Effort normal and breath sounds normal. No  respiratory distress.  Abdominal: Soft. She exhibits no distension. There is no tenderness.  Musculoskeletal: She exhibits tenderness and deformity. She exhibits no edema.  Obvious deformity to the left shoulder.  Left arm range of motion is reduced due to the shoulder injury.  No other focal tenderness throughout the left arm aside from diffuse tenderness about the shoulder.  Tenderness throughout her thoracic or lumbar spine.  Range of motion in all of her other major joints and extremities are intact.  The patient has normal grip strength in her left hand.  Sensation and pulses are intact.  Neurological: She is alert and oriented to person, place, and time.  Skin: Skin is warm and dry.  Psychiatric: She has a normal mood and affect.  Nursing note and vitals reviewed.    ED Treatments / Results  Labs (all labs ordered are listed, but only abnormal results are displayed) Labs Reviewed  I-STAT BETA HCG BLOOD, ED ([facility], [facility], [facility] ONLY)    EKG None  Radiology Dg Elbow Complete Left (3+view)  Result Date: 06/20/2017 CLINICAL DATA:  Fall EXAM: LEFT ELBOW - COMPLETE 3+ VIEW COMPARISON:  None. FINDINGS: No fracture or dislocation.  No significant elbow effusion. IMPRESSION: Negative. Electronically Signed   By: Jaleiah Pang M.D.   On: 06/20/2017 21:01   Dg Forearm Left  Result Date: 06/20/2017 CLINICAL DATA:  Fall EXAM: LEFT FOREARM - 2 VIEW COMPARISON:  None. FINDINGS: No acute displaced fracture or malalignment. Soft tissues are unremarkable. IMPRESSION: No acute osseous abnormality Electronically Signed   By: Ariane Pang M.D.   On: 06/20/2017 21:03   Dg Shoulder Left  Result Date: 06/20/2017 CLINICAL DATA:  Larey Seat down 12 stairs EXAM: LEFT SHOULDER - 2+ VIEW COMPARISON:  None. FINDINGS: AC joint appears intact. Anterior inferior dislocation of left humeral head. Suspected acute inferior glenoid rim fracture IMPRESSION: 1. Anterior shoulder dislocation with suspected acute fracture of the  inferior glenoid rim. Electronically Signed   By: Rekisha Pang M.D.   On: 06/20/2017 21:00   Dg Shoulder Left Portable  Result Date: 06/20/2017 CLINICAL DATA:  Shoulder dislocation, postreduction. EXAM: LEFT SHOULDER - 1 VIEW COMPARISON:  Pre reduction radiographs earlier this day. FINDINGS: Prior anterior shoulder dislocation has been reduced, alignment is now anatomic. The previous suggested inferior glenoid fracture fragment is not currently well seen. Normal acromioclavicular alignment. IMPRESSION: Reduction of prior anterior shoulder dislocation. Prior suspected inferior glenoid fracture fragment is not visualized. Electronically Signed   By: Rubye Oaks M.D.   On: 06/20/2017  23:55   Dg Humerus Left  Result Date: 06/20/2017 CLINICAL DATA:  Larey Seat down stairs EXAM: LEFT HUMERUS - 2+ VIEW COMPARISON:  None. FINDINGS: Anterior dislocation of the left humeral head. No acute displaced fracture of the humerus. IMPRESSION: Anterior dislocation of the left humeral head. Electronically Signed   By: Shayanne Pang M.D.   On: 06/20/2017 21:01   Dg Hand Complete Left  Result Date: 06/20/2017 CLINICAL DATA:  Larey Seat down stairs EXAM: LEFT HAND - COMPLETE 3+ VIEW COMPARISON:  None. FINDINGS: No fracture or malalignment. Joint spaces are maintained. No radiopaque foreign body in the soft tissues IMPRESSION: No acute osseous abnormality Electronically Signed   By: Kenleigh Pang M.D.   On: 06/20/2017 21:04    Procedures Reduction of dislocation Date/Time: 06/20/2017 11:43 PM Performed by: Lennette Bihari, MD Authorized by: Nira Conn, MD  Consent: Verbal consent obtained. Written consent obtained. Risks and benefits: risks, benefits and alternatives were discussed Patient identity confirmed: verbally with patient and arm band Time out: Immediately prior to procedure a "time out" was called to verify the correct patient, procedure, equipment, support staff and site/side marked as  required. Local anesthesia used: no  Anesthesia: Local anesthesia used: no  Sedation: Patient sedated: yes Sedatives: ketamine and propofol Vitals: Vital signs were monitored during sedation.  Patient tolerance: Patient tolerated the procedure well with no immediate complications Comments: Left shoulder dislocation reduced successfully.    (including critical care time)  Medications Ordered in ED Medications  oxyCODONE-acetaminophen (PERCOCET/ROXICET) 5-325 MG per tablet 1 tablet (1 tablet Oral Given 06/20/17 1946)  ketamine (KETALAR) injection 80 mg (has no administration in time range)  propofol (DIPRIVAN) 10 mg/mL bolus/IV push 79.8 mg (has no administration in time range)  ketamine (KETALAR) 10 MG/ML injection (has no administration in time range)  sodium chloride 0.9 % bolus 1,000 mL (0 mLs Intravenous Stopped 06/20/17 2358)  ondansetron (ZOFRAN) injection 4 mg (4 mg Intravenous Given 06/20/17 2329)  ketamine (KETALAR) injection (80 mg Intravenous Given 06/20/17 2335)  propofol (DIPRIVAN) 10 mg/mL bolus/IV push (40 mg Intravenous Given 06/20/17 2335)     Initial Impression / Assessment and Plan / ED Course  I have reviewed the triage vital signs and the nursing notes.  Pertinent labs & imaging results that were available during my care of the patient were reviewed by me and considered in my medical decision making (see chart for details).    Patient is a 22 year old female with no pertinent history who presents after she fell down her steps at home.  She had a mechanical fall.  She did hit her head but did not lose consciousness.  Her neurologic exam is normal here.  She was having left shoulder pain.  X-rays confirmed anterior dislocation as well as inferior glenoid rim fracture.  She is neurovascularly intact.  She was sedated with ketamine and propofol and her shoulder was successfully reduced as above.  She was placed in a sling.  She will need to follow-up with orthopedics.   She was instructed to do so.  She is stable for discharge at this time.  Return precautions discussed in detail.  Final Clinical Impressions(s) / ED Diagnoses   Final diagnoses:  Shoulder dislocation, left, initial encounter  Glenoid fracture of shoulder, left, closed, initial encounter    ED Discharge Orders        Ordered    morphine (MSIR) 15 MG tablet  Every 6 hours PRN,   Status:  Discontinued  06/21/17 0002    morphine (MSIR) 15 MG tablet  Every 6 hours PRN     06/21/17 0011       Lennette Bihari, MD 06/21/17 0014    Nira Conn, MD 06/21/17 [phone]

## 2017-06-21 MED ORDER — MORPHINE SULFATE 15 MG PO TABS: 15.0000 mg | ORAL_TABLET | Freq: Four times a day (QID) | ORAL | 0 refills | Status: DC | PRN

## 2017-06-21 NOTE — ED Notes (Signed)
Discharge instructions given to sister at bedside, who is driving patient home. Pt leaving now with all belongings

## 2017-06-21 NOTE — ED Provider Notes (Signed)
I have personally seen and examined the patient. I have reviewed the documentation on PMH/FH/Soc Hx. I have discussed the plan of care with the resident and patient.  I have reviewed and agree with the resident's documentation. Please see associated encounter note.  Briefly, the patient is a 22 y.o. female here with he after fall resulting in dislocation of the left shoulder.  Reduction under conscious sedation.  No other evidence of trauma.  .Sedation Date/Time: 06/20/2017 11:30 PM Performed by: Nira Conn, MD Authorized by: Nira Conn, MD   Consent:    Consent obtained:  Verbal   Consent given by:  Patient   Risks discussed:  Allergic reaction, dysrhythmia, inadequate sedation, nausea, prolonged hypoxia resulting in organ damage, prolonged sedation necessitating reversal, respiratory compromise necessitating ventilatory assistance and intubation and vomiting   Alternatives discussed:  Analgesia without sedation, anxiolysis and regional anesthesia Universal protocol:    Procedure explained and questions answered to patient or proxy's satisfaction: yes     Relevant documents present and verified: yes     Test results available and properly labeled: yes     Imaging studies available: yes     Required blood products, implants, devices, and special equipment available: yes     Site/side marked: yes     Immediately prior to procedure a time out was called: yes     Patient identity confirmation method:  Verbally with patient Indications:    Procedure necessitating sedation performed by:  Physician performing sedation   Intended level of sedation:  Deep Pre-sedation assessment:    Time since last food or drink:  1600   ASA classification: class 1 - normal, healthy patient     Neck mobility: normal     Mouth opening:  3 or more finger widths   Thyromental distance:  4 finger widths   Mallampati score:  I - soft palate, uvula, fauces, pillars visible   Pre-sedation  assessments completed and reviewed: airway patency, cardiovascular function, hydration status, mental status, nausea/vomiting, pain level, respiratory function and temperature     Pre-sedation assessment completed:  06/20/2017 11:00 PM Immediate pre-procedure details:    Reassessment: Patient reassessed immediately prior to procedure     Reviewed: vital signs, relevant labs/tests and NPO status     Verified: bag valve mask available, emergency equipment available, intubation equipment available, IV patency confirmed, oxygen available and suction available   Procedure details (see MAR for exact dosages):    Preoxygenation:  Nasal cannula   Sedation:  Propofol   Intra-procedure monitoring:  Blood pressure monitoring, cardiac monitor, continuous pulse oximetry, frequent LOC assessments, frequent vital sign checks and continuous capnometry   Intra-procedure events: none     Total Provider sedation time (minutes):  5 Post-procedure details:    Post-sedation assessment completed:  06/21/2017 12:17 AM   Attendance: Constant attendance by certified staff until patient recovered     Recovery: Patient returned to pre-procedure baseline     Post-sedation assessments completed and reviewed: airway patency, cardiovascular function, hydration status, mental status, nausea/vomiting, pain level, respiratory function and temperature     Patient is stable for discharge or admission: yes     Patient tolerance:  Tolerated well, no immediate complications      Pepe Mineau, Amadeo Garnet, MD 06/21/17 [phone]

## 2017-06-21 NOTE — Discharge Instructions (Addendum)
-   Wear your shoulder sling until you follow up with orthopedics - Do shoulder exercises each day according to the attached document to prevent a frozen shoulder

## 2018-12-03 ENCOUNTER — Other Ambulatory Visit: Payer: Self-pay

## 2018-12-03 DIAGNOSIS — Z20822 Contact with and (suspected) exposure to covid-19: Secondary | ICD-10-CM

## 2018-12-05 LAB — NOVEL CORONAVIRUS, NAA: SARS-CoV-2, NAA: NOT DETECTED

## 2018-12-11 ENCOUNTER — Telehealth: Payer: Self-pay | Admitting: General Practice

## 2018-12-11 NOTE — Telephone Encounter (Signed)
Patient called in and received her covid test result  °

## 2019-04-29 ENCOUNTER — Other Ambulatory Visit: Payer: Self-pay

## 2019-04-29 ENCOUNTER — Encounter (HOSPITAL_COMMUNITY): Payer: Self-pay

## 2019-04-29 ENCOUNTER — Ambulatory Visit (HOSPITAL_COMMUNITY)
Admission: EM | Admit: 2019-04-29 | Discharge: 2019-04-29 | Disposition: A | Discharge status: Home / Self Care | Payer: Self-pay

## 2019-04-29 DIAGNOSIS — K047 Periapical abscess without sinus: Secondary | ICD-10-CM

## 2019-04-29 DIAGNOSIS — R220 Localized swelling, mass and lump, head: Secondary | ICD-10-CM

## 2019-04-29 MED ORDER — CLINDAMYCIN HCL 150 MG PO CAPS: 150.0000 mg | ORAL_CAPSULE | Freq: Four times a day (QID) | ORAL | 0 refills | Status: AC

## 2019-04-29 NOTE — ED Triage Notes (Signed)
Pt c/o upper right dental/tooth pain for approx 3 weeks. Facial swelling onset last night to right cheek area. Denies fever, chills, n/v/d.

## 2019-04-29 NOTE — Discharge Instructions (Addendum)
Take the antibiotic as prescribed.    A dental resource guide is attached.  Please call to make an appointment with a dentist as soon as possible.    Return here or go to the emergency department if you develop increased pain, swelling, fever, chills, or other concerning symptoms.     

## 2019-04-29 NOTE — ED Provider Notes (Signed)
[facility]    CSN: [id number] Arrival date & time: 04/29/19  0849      History   Chief Complaint Chief Complaint  Patient presents with  . Dental Pain  . Facial Swelling    HPI Morgan Kirk is a 24 y.o. female.   Patient reports that she has a "bad tooth" upper right side of her mouth.  She thinks that this has been here for months.  Reports pain with eating and chewing.  Denies any drainage from her gums in the area.  Reports that she thinks that it is infected.  Denies having a Education officer, community.  Denies having dental insurance.  Reports that she has made no attempts to treat this at home.  Reports that this morning when she woke up the right side of her face was swollen and she is having sharp pains in the area as well.  Denies headache, cough, shortness of breath, chest tightness, chills, body aches, fever, rash, other symptoms.  Patient has no significant medical history per chart review.  The history is provided by the patient.    Past Medical History:  Diagnosis Date  . Chlamydia   . Ovarian cyst   . Ovarian cyst     Patient Active Problem List   Diagnosis Date Noted  . Pelvic pain 12/14/2016    Past Surgical History:  Procedure Laterality Date  . NO PAST SURGERIES      OB History    Gravida  0   Para      Term      Preterm      AB  0   Living  0     SAB  0   TAB      Ectopic      Multiple      Live Births               Home Medications    Prior to Admission medications   Medication Sig Start Date End Date Taking? Authorizing Provider  acetaminophen (TYLENOL) 325 MG tablet Take 650 mg by mouth every 6 (six) hours as needed.   Yes [provider]  clindamycin (CLEOCIN) 150 MG capsule Take 1 capsule (150 mg total) by mouth every 6 (six) hours for 7 days. 04/29/19 05/06/19  Moshe Cipro, NP  cyclobenzaprine (FLEXERIL) 10 MG tablet Take 1 tablet (10 mg total) by mouth 3 (three) times daily as needed for muscle  spasms. Patient not taking: Reported on 06/20/2017 02/17/17   Street, [place], PA-C  morphine (MSIR) 15 MG tablet Take 1 tablet (15 mg total) by mouth every 6 (six) hours as needed for severe pain. 06/21/17   Lennette Bihari, MD  naproxen (NAPROSYN) 500 MG tablet Take 1 tablet (500 mg total) by mouth 2 (two) times daily as needed for mild pain, moderate pain or headache (TAKE WITH MEALS.). Patient not taking: Reported on 06/20/2017 02/17/17   Street, [place], PA-C    Family History Family History  Problem Relation Age of Onset  . Cancer Neg Hx   . Diabetes Neg Hx   . Hypertension Neg Hx     Social History Social History   Tobacco Use  . Smoking status: Current Some Day Smoker    Types: Cigarettes  . Smokeless tobacco: Never Used  Substance Use Topics  . Alcohol use: Yes    Comment: occasional  . Drug use: Yes    Types: Marijuana    Comment: 3 da;ys ago  Allergies   Patient has no known allergies.   Review of Systems Review of Systems  Constitutional: Negative for chills, fatigue and fever.  HENT: Positive for dental problem and facial swelling. Negative for drooling, ear pain, sinus pressure, sinus pain and sore throat.        Right-sided facial swelling  Eyes: Negative for pain and visual disturbance.  Respiratory: Negative for cough, shortness of breath, wheezing and stridor.   Cardiovascular: Negative for chest pain and palpitations.  Gastrointestinal: Negative for abdominal pain, nausea and vomiting.  Endocrine: Negative.   Genitourinary: Negative.  Negative for dysuria and hematuria.  Musculoskeletal: Negative for arthralgias, back pain and myalgias.  Skin: Negative for color change and rash.  Allergic/Immunologic: Negative.   Neurological: Negative for seizures and syncope.  Psychiatric/Behavioral: Negative.   All other systems reviewed and are negative.    Physical Exam Triage Vital Signs ED Triage Vitals  Enc Vitals Group     BP 04/29/19 0902  (!) 145/93     Pulse Rate 04/29/19 0902 82     Resp 04/29/19 0902 18     Temp 04/29/19 0902 98.9 F (37.2 C)     Temp Source 04/29/19 0902 Oral     SpO2 04/29/19 0902 100 %     Weight --      Height --      Head Circumference --      Peak Flow --      Pain Score 04/29/19 0900 10     Pain Loc --      Pain Edu? --      Excl. in GC? --    No data found.  Updated Vital Signs BP (!) 145/93 (BP Location: Left Arm)   Pulse 82   Temp 98.9 F (37.2 C) (Oral)   Resp 18   LMP 04/15/2019   SpO2 100%   Visual Acuity Right Eye Distance:   Left Eye Distance:   Bilateral Distance:    Right Eye Near:   Left Eye Near:    Bilateral Near:     Physical Exam Vitals and nursing note reviewed.  Constitutional:      General: She is not in acute distress.    Appearance: She is well-developed and normal weight. She is ill-appearing.  HENT:     Head: Normocephalic and atraumatic.     Jaw: Tenderness, swelling and pain on movement present.     Right Ear: Tympanic membrane normal.     Left Ear: Tympanic membrane normal.     Nose: Nose normal.     Mouth/Throat:     Lips: Pink.     Dentition: Dental tenderness, gingival swelling, dental caries and dental abscesses present.     Pharynx: Oropharynx is clear. Uvula midline.      Comments: Areas of dental caries and gingival swelling Eyes:     Extraocular Movements: Extraocular movements intact.     Conjunctiva/sclera: Conjunctivae normal.     Pupils: Pupils are equal, round, and reactive to light.  Cardiovascular:     Rate and Rhythm: Normal rate and regular rhythm.     Heart sounds: Normal heart sounds. No murmur.  Pulmonary:     Effort: Pulmonary effort is normal. No respiratory distress.     Breath sounds: Normal breath sounds.  Abdominal:     General: Bowel sounds are normal. There is no distension.     Palpations: Abdomen is soft. There is no mass.     Tenderness: There is no abdominal  tenderness. There is no guarding or rebound.      Hernia: No hernia is present.  Musculoskeletal:     Cervical back: Neck supple.  Skin:    General: Skin is warm and dry.     Capillary Refill: Capillary refill takes less than 2 seconds.  Neurological:     General: No focal deficit present.     Mental Status: She is alert and oriented to person, place, and time.  Psychiatric:        Mood and Affect: Mood normal.        Behavior: Behavior normal.      UC Treatments / Results  Labs (all labs ordered are listed, but only abnormal results are displayed) Labs Reviewed - No data to display  EKG   Radiology No results found.  Procedures Procedures (including critical care time)  Medications Ordered in UC Medications - No data to display  Initial Impression / Assessment and Plan / UC Course  I have reviewed the triage vital signs and the nursing notes.  Pertinent labs & imaging results that were available during my care of the patient were reviewed by me and considered in my medical decision making (see chart for details).    Dental pain and facial swelling x2 days.  Dental caries and gingival swelling noted at right upper first, second and third tooth.  No drainage noted from the area.  Tenderness and swelling to the right side of the face.  Abscess is likely in areas of the first, second and third tooth. Sent in clindamycin 150 mg 3 times daily for 7 days.  Also gave patient information about where to receive low cost dental care.  Instructed patient that she needs to receive dental care to prevent the abscess from returning.  Discussed with patient that she may need to have these teeth pulled or have some extensive dental work done.  Instructed that if she is not feeling better in the next 24 hours, to follow-up here or with a dentist.  Patient agrees to treatment plan.  Patient instructed to go to the ER for severe symptoms including trouble swallowing, trouble breathing, high fever, other concerning symptoms. Final Clinical  Impressions(s) / UC Diagnoses   Final diagnoses:  Dental abscess  Facial swelling     Discharge Instructions     Take the antibiotic as prescribed.    A dental resource guide is attached.  Please call to make an appointment with a dentist as soon as possible.    Return here or go to the emergency department if you develop increased pain, swelling, fever, chills, or other concerning symptoms.        ED Prescriptions    Medication Sig Dispense Auth. Provider   clindamycin (CLEOCIN) 150 MG capsule Take 1 capsule (150 mg total) by mouth every 6 (six) hours for 7 days. 28 capsule Faustino Congress, NP     PDMP not reviewed this encounter.   Faustino Congress, NP 04/29/19 [phone]

## 2019-06-29 ENCOUNTER — Other Ambulatory Visit: Payer: Self-pay

## 2019-06-29 ENCOUNTER — Encounter (HOSPITAL_COMMUNITY): Payer: Self-pay | Admitting: Emergency Medicine

## 2019-06-29 ENCOUNTER — Emergency Department (HOSPITAL_COMMUNITY)
Admission: EM | Admit: 2019-06-29 | Discharge: 2019-06-29 | Disposition: A | Discharge status: Home / Self Care | Payer: Medicaid Other | Attending: Emergency Medicine | Admitting: Emergency Medicine

## 2019-06-29 DIAGNOSIS — Z79899 Other long term (current) drug therapy: Secondary | ICD-10-CM | POA: Insufficient documentation

## 2019-06-29 DIAGNOSIS — B3731 Acute candidiasis of vulva and vagina: Secondary | ICD-10-CM

## 2019-06-29 DIAGNOSIS — F17210 Nicotine dependence, cigarettes, uncomplicated: Secondary | ICD-10-CM | POA: Insufficient documentation

## 2019-06-29 DIAGNOSIS — B373 Candidiasis of vulva and vagina: Secondary | ICD-10-CM | POA: Insufficient documentation

## 2019-06-29 DIAGNOSIS — N898 Other specified noninflammatory disorders of vagina: Secondary | ICD-10-CM

## 2019-06-29 LAB — URINALYSIS, ROUTINE W REFLEX MICROSCOPIC
Bilirubin Urine: NEGATIVE
Glucose, UA: NEGATIVE mg/dL
Ketones, ur: NEGATIVE mg/dL
Nitrite: NEGATIVE
Protein, ur: NEGATIVE mg/dL
Specific Gravity, Urine: 1.014 (ref 1.005–1.030)
pH: 6 (ref 5.0–8.0)

## 2019-06-29 LAB — CBC
HCT: 38.7 % (ref 36.0–46.0)
Hemoglobin: 12.8 g/dL (ref 12.0–15.0)
MCH: 30.3 pg (ref 26.0–34.0)
MCHC: 33.1 g/dL (ref 30.0–36.0)
MCV: 91.5 fL (ref 80.0–100.0)
Platelets: 267 10*3/uL (ref 150–400)
RBC: 4.23 MIL/uL (ref 3.87–5.11)
RDW: 12.5 % (ref 11.5–15.5)
WBC: 5 10*3/uL (ref 4.0–10.5)
nRBC: 0 % (ref 0.0–0.2)

## 2019-06-29 LAB — COMPREHENSIVE METABOLIC PANEL
ALT: 14 U/L (ref 0–44)
AST: 15 U/L (ref 15–41)
Albumin: 3.9 g/dL (ref 3.5–5.0)
Alkaline Phosphatase: 66 U/L (ref 38–126)
Anion gap: 10 (ref 5–15)
BUN: 15 mg/dL (ref 6–20)
CO2: 25 mmol/L (ref 22–32)
Calcium: 9 mg/dL (ref 8.9–10.3)
Chloride: 106 mmol/L (ref 98–111)
Creatinine, Ser: 0.73 mg/dL (ref 0.44–1.00)
GFR calc Af Amer: >60 mL/min (ref >60)
GFR calc non Af Amer: >60 mL/min (ref >60)
Glucose, Bld: 97 mg/dL (ref 70–99)
Potassium: 3.9 mmol/L (ref 3.5–5.1)
Sodium: 141 mmol/L (ref 135–145)
Total Bilirubin: 0.4 mg/dL (ref 0.3–1.2)
Total Protein: 7.2 g/dL (ref 6.5–8.1)

## 2019-06-29 LAB — I-STAT BETA HCG BLOOD, ED (MC, WL, AP ONLY): I-stat hCG, quantitative: <5 m[IU]/mL (ref <5)

## 2019-06-29 LAB — WET PREP, GENITAL
Sperm: NONE SEEN
Trich, Wet Prep: NONE SEEN

## 2019-06-29 LAB — HIV ANTIBODY (ROUTINE TESTING W REFLEX): HIV Screen 4th Generation wRfx: NONREACTIVE

## 2019-06-29 LAB — LIPASE, BLOOD: Lipase: 22 U/L (ref 11–51)

## 2019-06-29 MED ORDER — FLUCONAZOLE 150 MG PO TABS: 150.0000 mg | ORAL_TABLET | Freq: Once | ORAL | 0 refills | Status: AC

## 2019-06-29 MED ORDER — SODIUM CHLORIDE 0.9% FLUSH: 3.0000 mL | Freq: Once | INTRAVENOUS | Status: DC

## 2019-06-29 NOTE — ED Triage Notes (Signed)
C/o generalized abd pain and pelvic pain x 2 days.  Reports vaginal itching since last night.

## 2019-06-29 NOTE — ED Provider Notes (Signed)
[facility] EMERGENCY DEPARTMENT Provider Note   CSN: [id number] Arrival date & time: 06/29/19  1102     History Chief Complaint  Patient presents with  . Abdominal Pain    Morgan Kirk is a 24 y.o. female presented for evaluation of lower abdominal pain and vaginal discharge.  Patient states for the past week, she has been having lower abdominal pain.  She states it is constant and cramping.  Is mostly in the middle of her lower abdomen.  She states she did not have a period this month, is abnormal for her.  She is sexually active with one female partner, they do not use condoms or contraception.  Patient states last night she developed vaginal itching, and noticed discharge today.Marland Kitchen  She would like to be tested for all STDs.  She denies fevers, chills, nausea, vomiting, urinary symptoms, abnormal bowel movements.  She states she has no other medical problems, takes no medications daily.  She is not taking anything for her symptoms.  She does not have an OB/GYN currently.  HPI     Past Medical History:  Diagnosis Date  . Chlamydia   . Ovarian cyst   . Ovarian cyst     Patient Active Problem List   Diagnosis Date Noted  . Pelvic pain 12/14/2016    Past Surgical History:  Procedure Laterality Date  . NO PAST SURGERIES       OB History    Gravida  0   Para      Term      Preterm      AB  0   Living  0     SAB  0   TAB      Ectopic      Multiple      Live Births              Family History  Problem Relation Age of Onset  . Cancer Neg Hx   . Diabetes Neg Hx   . Hypertension Neg Hx     Social History   Tobacco Use  . Smoking status: Current Some Day Smoker    Types: Cigarettes  . Smokeless tobacco: Never Used  Substance Use Topics  . Alcohol use: Yes    Comment: occasional  . Drug use: Yes    Types: Marijuana    Comment: 3 da;ys ago    Home Medications Prior to Admission medications   Medication Sig Start Date  End Date Taking? Authorizing Provider  acetaminophen (TYLENOL) 325 MG tablet Take 650 mg by mouth every 6 (six) hours as needed.    [provider]  cyclobenzaprine (FLEXERIL) 10 MG tablet Take 1 tablet (10 mg total) by mouth 3 (three) times daily as needed for muscle spasms. Patient not taking: Reported on 06/20/2017 02/17/17   Street, [place], PA-C  fluconazole (DIFLUCAN) 150 MG tablet Take 1 tablet (150 mg total) by mouth once for 1 dose. 06/29/19 06/29/19  Nicholos Aloisi, PA-C  morphine (MSIR) 15 MG tablet Take 1 tablet (15 mg total) by mouth every 6 (six) hours as needed for severe pain. 06/21/17   Clifton James, MD  naproxen (NAPROSYN) 500 MG tablet Take 1 tablet (500 mg total) by mouth 2 (two) times daily as needed for mild pain, moderate pain or headache (TAKE WITH MEALS.). Patient not taking: Reported on 06/20/2017 02/17/17   Street, [place], [place]    Allergies    Patient has no known allergies.  Review of  Systems   Review of Systems  Constitutional: Negative for fever.  Gastrointestinal: Positive for abdominal pain (Lower abdomen).  Genitourinary: Positive for pelvic pain and vaginal bleeding.  All other systems reviewed and are negative.   Physical Exam Updated Vital Signs BP (!) 135/99 (BP Location: Right Arm)   Pulse 64   Temp 97.9 F (36.6 C) (Oral)   Resp 14   Ht 5\' 5"  (1.651 m)   Wt 79.4 kg   LMP 05/10/2019   SpO2 100%   BMI 29.12 kg/m   Physical Exam Vitals and nursing note reviewed. Exam conducted with a chaperone present.  Constitutional:      General: She is not in acute distress.    Appearance: She is well-developed.     Comments: Resting comfortably in the bed no acute distress  HENT:     Head: Normocephalic and atraumatic.  Eyes:     Conjunctiva/sclera: Conjunctivae normal.     Pupils: Pupils are equal, round, and reactive to light.  Cardiovascular:     Rate and Rhythm: Normal rate and regular rhythm.     Pulses: Normal pulses.   Pulmonary:     Effort: Pulmonary effort is normal. No respiratory distress.     Breath sounds: Normal breath sounds. No wheezing.  Abdominal:     General: There is no distension.     Palpations: Abdomen is soft. There is no mass.     Tenderness: There is abdominal tenderness in the suprapubic area. There is no guarding or rebound.     Comments: Tenderness palpation of low suprapubic abdomen.  Tenderness palpation elsewhere in the abdomen.  No rigidity, guarding, distention.  Negative rebound.  No peritonitis.  Genitourinary:    Vagina: Normal.     Cervix: Discharge present.     Uterus: Normal.      Adnexa: Right adnexa normal and left adnexa normal.     Comments: Thick white discharge.  No CMT or adnexal tenderness.  No bleeding or friability. Musculoskeletal:        General: Normal range of motion.     Cervical back: Normal range of motion and neck supple.  Skin:    General: Skin is warm and dry.     Capillary Refill: Capillary refill takes less than 2 seconds.  Neurological:     Mental Status: She is alert and oriented to person, place, and time.     ED Results / Procedures / Treatments   Labs (all labs ordered are listed, but only abnormal results are displayed) Labs Reviewed  WET PREP, GENITAL - Abnormal; Notable for the following components:      Result Value   Yeast Wet Prep HPF POC PRESENT (*)    Clue Cells Wet Prep HPF POC PRESENT (*)    WBC, Wet Prep HPF POC MANY (*)    All other components within normal limits  URINALYSIS, ROUTINE W REFLEX MICROSCOPIC - Abnormal; Notable for the following components:   APPearance CLOUDY (*)    Hgb urine dipstick SMALL (*)    Leukocytes,Ua LARGE (*)    Bacteria, UA RARE (*)    All other components within normal limits  LIPASE, BLOOD  COMPREHENSIVE METABOLIC PANEL  CBC  HIV ANTIBODY (ROUTINE TESTING W REFLEX)  RPR  I-STAT BETA HCG BLOOD, ED ([facility], [facility], [facility] ONLY)  GC/CHLAMYDIA PROBE AMP ([place]) NOT AT [facility]     EKG None  Radiology No results found.  Procedures Procedures (including critical care time)  Medications Ordered in  ED Medications  sodium chloride flush (NS) 0.9 % injection 3 mL (3 mLs Intravenous Not Given 06/29/19 1159)    ED Course  I have reviewed the triage vital signs and the nursing notes.  Pertinent labs & imaging results that were available during my care of the patient were reviewed by me and considered in my medical decision making (see chart for details).    MDM Rules/Calculators/A&P                      Presenting for evaluation of abdominal pain and vaginal discharge.  On exam, patient is nontoxic.  She does have some mild suprapubic pain.  GU exam is overall reassuring with clumpy white discharge, but no CMT or adnexal tenderness.  In the setting of missing a period, consider pregnancy.  Consider yeast infection versus STD.  Less likely PID, TOA, or torsion.  Labs interpreted by me, overall reassuring.  No leukocytosis. Pregnancy negative.  Kidney and liver function is normal.  Hemoglobin is stable.  Wet prep shows yeast, no trichomoniasis.  Does show clue cells, likely due to yeast.  Urine has large leuks, but no nitrates and 11-20 squamous cells.  In the setting of no urinary symptoms, doubt UTI.  Discussed with patient the results of gonorrhea, chlamydia, HIV, and syphilis are pending.  Offered treatment for gonorrhea/chlamydia, patient declined at this time.  Information given for follow-up with OB/GYN.  At this time, patient appears safe for d/c. Return precautions given.  Patient states she understands and agrees to plan.  Final Clinical Impression(s) / ED Diagnoses Final diagnoses:  Yeast vaginitis  Vaginal discharge    Rx / DC Orders ED Discharge Orders         Ordered    fluconazole (DIFLUCAN) 150 MG tablet   Once     06/29/19 1250           Alveria Apley, PA-C 06/29/19 1417    Tilden Fossa, MD 06/30/19 [phone]

## 2019-06-29 NOTE — Discharge Instructions (Signed)
Take the Diflucan to treat your yeast infection. If you need further treatment, you may try any over-the-counter yeast creams. Use Tylenol or ibuprofen as needed for pain or pain. You may use a heating pad to help with pain. Testing for gonorrhea, chlamydia, HIV, syphilis is pending.  If results are positive, you will receive a phone call.  If negative, you will not.  Either way, you may check online on MyChart. Follow-up with OB/GYN listed below to establish care and for further evaluation as needed. Return to the emergency room with any new, worsening, concerning symptoms.

## 2019-06-30 LAB — RPR
RPR Ser Ql: REACTIVE — AB
RPR Titer: 1:1 {titer}

## 2019-07-02 LAB — GC/CHLAMYDIA PROBE AMP (~~LOC~~) NOT AT ARMC
Chlamydia: NEGATIVE
Comment: NEGATIVE
Comment: NORMAL
Neisseria Gonorrhea: NEGATIVE

## 2019-07-02 LAB — T.PALLIDUM AB, TOTAL: T Pallidum Abs: REACTIVE — AB

## 2019-08-20 ENCOUNTER — Ambulatory Visit (HOSPITAL_COMMUNITY)
Admission: EM | Admit: 2019-08-20 | Discharge: 2019-08-20 | Disposition: A | Discharge status: Home / Self Care | Payer: Self-pay | Attending: Family Medicine | Admitting: Family Medicine

## 2019-08-20 ENCOUNTER — Encounter (HOSPITAL_COMMUNITY): Payer: Self-pay

## 2019-08-20 ENCOUNTER — Other Ambulatory Visit: Payer: Self-pay

## 2019-08-20 ENCOUNTER — Emergency Department (HOSPITAL_COMMUNITY)
Admission: EM | Admit: 2019-08-20 | Discharge: 2019-08-20 | Disposition: A | Discharge status: Home / Self Care | Payer: Medicaid Other | Attending: Emergency Medicine | Admitting: Emergency Medicine

## 2019-08-20 ENCOUNTER — Encounter (HOSPITAL_COMMUNITY): Payer: Self-pay | Admitting: Emergency Medicine

## 2019-08-20 DIAGNOSIS — Z5321 Procedure and treatment not carried out due to patient leaving prior to being seen by health care provider: Secondary | ICD-10-CM | POA: Insufficient documentation

## 2019-08-20 DIAGNOSIS — M549 Dorsalgia, unspecified: Secondary | ICD-10-CM | POA: Insufficient documentation

## 2019-08-20 DIAGNOSIS — Z7251 High risk heterosexual behavior: Secondary | ICD-10-CM | POA: Insufficient documentation

## 2019-08-20 DIAGNOSIS — Z202 Contact with and (suspected) exposure to infections with a predominantly sexual mode of transmission: Secondary | ICD-10-CM | POA: Insufficient documentation

## 2019-08-20 DIAGNOSIS — Z8619 Personal history of other infectious and parasitic diseases: Secondary | ICD-10-CM | POA: Diagnosis present

## 2019-08-20 DIAGNOSIS — R102 Pelvic and perineal pain: Secondary | ICD-10-CM | POA: Insufficient documentation

## 2019-08-20 LAB — POCT URINALYSIS DIP (DEVICE)
Bilirubin Urine: NEGATIVE
Glucose, UA: NEGATIVE mg/dL
Hgb urine dipstick: NEGATIVE
Ketones, ur: NEGATIVE mg/dL
Leukocytes,Ua: NEGATIVE
Nitrite: NEGATIVE
Protein, ur: NEGATIVE mg/dL
Specific Gravity, Urine: 1.025 (ref 1.005–1.030)
Urobilinogen, UA: 0.2 mg/dL (ref 0.0–1.0)
pH: 7 (ref 5.0–8.0)

## 2019-08-20 LAB — POC URINE PREG, ED: Preg Test, Ur: NEGATIVE

## 2019-08-20 LAB — HIV ANTIBODY (ROUTINE TESTING W REFLEX): HIV Screen 4th Generation wRfx: NONREACTIVE

## 2019-08-20 MED ORDER — CEFTRIAXONE SODIUM 500 MG IJ SOLR
500.0000 mg | Freq: Once | INTRAMUSCULAR | Status: AC
  Administered 2019-08-20: 500 mg via INTRAMUSCULAR

## 2019-08-20 MED ORDER — DOXYCYCLINE HYCLATE 100 MG PO CAPS
100.0000 mg | ORAL_CAPSULE | Freq: Two times a day (BID) | ORAL | 0 refills | Status: DC
Start: 2019-08-20 — End: 2019-10-08

## 2019-08-20 MED ORDER — STERILE WATER FOR INJECTION IJ SOLN
INTRAMUSCULAR | Status: AC
  Filled 2019-08-20: qty 10

## 2019-08-20 MED ORDER — CEFTRIAXONE SODIUM 500 MG IJ SOLR
INTRAMUSCULAR | Status: AC
  Filled 2019-08-20: qty 500

## 2019-08-20 NOTE — ED Triage Notes (Signed)
Per pt, states she started having severe pelvic pain this am-radiating to back

## 2019-08-20 NOTE — Discharge Instructions (Addendum)
No sexual encounters for 7 days Your results will be available in My Chart.

## 2019-08-20 NOTE — ED Triage Notes (Signed)
Pt c/o 10/10 stabbing throbbing pelvic pain started last night. Pt c/o white vaginal discharge.

## 2019-08-20 NOTE — ED Provider Notes (Signed)
[facility]    CSN: [id number] Arrival date & time: 08/20/19  1109      History   Chief Complaint Chief Complaint  Patient presents with  . Pelvic Pain    HPI Morgan Kirk is a 24 y.o. female.   HPI  Patient is here for suprapubic pain.  She has some pressure and pain that comes and goes.  No dysuria.  No frequency.  She has a white vaginal discharge that she thinks is vaginitis.  Mild itching.  No fever chills.  No nausea vomiting. She would also like STD testing.  Past Medical History:  Diagnosis Date  . Chlamydia   . Ovarian cyst   . Ovarian cyst     Patient Active Problem List   Diagnosis Date Noted  . History of syphilis 08/20/2019  . Pelvic pain 12/14/2016    Past Surgical History:  Procedure Laterality Date  . NO PAST SURGERIES      OB History    Gravida  0   Para      Term      Preterm      AB  0   Living  0     SAB  0   TAB      Ectopic      Multiple      Live Births               Home Medications    Prior to Admission medications   Medication Sig Start Date End Date Taking? Authorizing Provider  acetaminophen (TYLENOL) 325 MG tablet Take 650 mg by mouth every 6 (six) hours as needed.    [provider]  doxycycline (VIBRAMYCIN) 100 MG capsule Take 1 capsule (100 mg total) by mouth 2 (two) times daily. 08/20/19   Eustace Moore, MD    Family History Family History  Problem Relation Age of Onset  . Cancer Neg Hx   . Diabetes Neg Hx   . Hypertension Neg Hx     Social History Social History   Tobacco Use  . Smoking status: Former Smoker    Types: Cigarettes  . Smokeless tobacco: Never Used  Substance Use Topics  . Alcohol use: Yes    Comment: occasional  . Drug use: Yes    Types: Marijuana    Comment: 3 da;ys ago     Allergies   Patient has no known allergies.   Review of Systems Review of Systems  See HPI  physical Exam Triage Vital Signs ED Triage Vitals  Enc Vitals  Group     BP 08/20/19 1119 (!) 140/99     Pulse Rate 08/20/19 1119 64     Resp 08/20/19 1119 16     Temp 08/20/19 1119 98.5 F (36.9 C)     Temp Source 08/20/19 1119 Oral     SpO2 08/20/19 1119 98 %     Weight 08/20/19 1121 170 lb (77.1 kg)     Height 08/20/19 1121 5\' 5"  (1.651 m)     Head Circumference --      Peak Flow --      Pain Score 08/20/19 1121 10     Pain Loc --      Pain Edu? --      Excl. in GC? --    No data found.  Updated Vital Signs BP (!) 140/99   Pulse 64   Temp 98.5 F (36.9 C) (Oral)   Resp 16   Ht 5'  5" (1.651 m)   Wt 77.1 kg   SpO2 98%   BMI 28.29 kg/m     Physical Exam Constitutional:      General: She is not in acute distress.    Appearance: She is well-developed and normal weight.  HENT:     Head: Normocephalic and atraumatic.     Mouth/Throat:     Comments: Mask is in place Eyes:     Conjunctiva/sclera: Conjunctivae normal.     Pupils: Pupils are equal, round, and reactive to light.  Cardiovascular:     Rate and Rhythm: Normal rate.  Pulmonary:     Effort: Pulmonary effort is normal. No respiratory distress.  Abdominal:     General: There is no distension.     Palpations: Abdomen is soft.     Tenderness: There is no abdominal tenderness.     Comments: No lower abdominal tenderness palpation  Musculoskeletal:        General: Normal range of motion.     Cervical back: Normal range of motion.  Skin:    General: Skin is warm and dry.  Neurological:     Mental Status: She is alert.  Psychiatric:        Mood and Affect: Mood normal.        Behavior: Behavior normal.      UC Treatments / Results  Labs (all labs ordered are listed, but only abnormal results are displayed) Labs Reviewed  HIV ANTIBODY (ROUTINE TESTING W REFLEX)  RPR  POC URINE PREG, ED  POCT URINALYSIS DIP (DEVICE)  CERVICOVAGINAL ANCILLARY ONLY    EKG   Radiology No results found.  Procedures Procedures (including critical care time)  Medications  Ordered in UC Medications  cefTRIAXone (ROCEPHIN) injection 500 mg (500 mg Intramuscular Given 08/20/19 1229)    Initial Impression / Assessment and Plan / UC Course  I have reviewed the triage vital signs and the nursing notes.  Pertinent labs & imaging results that were available during my care of the patient were reviewed by me and considered in my medical decision making (see chart for details).     Will treat for acute vaginitis.  Test for STDs.  Safe sex is recommended Final Clinical Impressions(s) / UC Diagnoses   Final diagnoses:  Pelvic pain in female  Unprotected sex  Possible exposure to STD     Discharge Instructions     No sexual encounters for 7 days Your results will be available in My Chart.    ED Prescriptions    Medication Sig Dispense Auth. Provider   doxycycline (VIBRAMYCIN) 100 MG capsule Take 1 capsule (100 mg total) by mouth 2 (two) times daily. 14 capsule Eustace Moore, MD     PDMP not reviewed this encounter.   Eustace Moore, MD 08/20/19 1524

## 2019-08-21 LAB — CERVICOVAGINAL ANCILLARY ONLY
Chlamydia: NEGATIVE
Comment: NEGATIVE
Comment: NEGATIVE
Comment: NORMAL
Neisseria Gonorrhea: NEGATIVE
Trichomonas: NEGATIVE

## 2019-08-21 LAB — RPR
RPR Ser Ql: REACTIVE — AB
RPR Titer: 1:1 {titer}

## 2019-08-22 LAB — T.PALLIDUM AB, TOTAL: T Pallidum Abs: REACTIVE — AB

## 2019-10-08 ENCOUNTER — Ambulatory Visit (HOSPITAL_COMMUNITY)
Admission: EM | Admit: 2019-10-08 | Discharge: 2019-10-08 | Disposition: A | Discharge status: Home / Self Care | Payer: Medicaid Other | Attending: Urgent Care | Admitting: Urgent Care

## 2019-10-08 ENCOUNTER — Other Ambulatory Visit: Payer: Self-pay

## 2019-10-08 DIAGNOSIS — R030 Elevated blood-pressure reading, without diagnosis of hypertension: Secondary | ICD-10-CM | POA: Diagnosis present

## 2019-10-08 DIAGNOSIS — Z87891 Personal history of nicotine dependence: Secondary | ICD-10-CM | POA: Insufficient documentation

## 2019-10-08 DIAGNOSIS — Z8249 Family history of ischemic heart disease and other diseases of the circulatory system: Secondary | ICD-10-CM | POA: Insufficient documentation

## 2019-10-08 DIAGNOSIS — Z20822 Contact with and (suspected) exposure to covid-19: Secondary | ICD-10-CM | POA: Diagnosis not present

## 2019-10-08 DIAGNOSIS — I10 Essential (primary) hypertension: Secondary | ICD-10-CM | POA: Diagnosis not present

## 2019-10-08 DIAGNOSIS — Z3202 Encounter for pregnancy test, result negative: Secondary | ICD-10-CM | POA: Diagnosis not present

## 2019-10-08 LAB — POC URINE PREG, ED: Preg Test, Ur: NEGATIVE

## 2019-10-08 MED ORDER — LISINOPRIL 10 MG PO TABS
10.0000 mg | ORAL_TABLET | Freq: Every day | ORAL | 0 refills | Status: DC
Start: 2019-10-08 — End: 2021-02-16

## 2019-10-08 NOTE — ED Notes (Signed)
Notified [place], [place] of pt's bp

## 2019-10-08 NOTE — ED Triage Notes (Signed)
Nurse visit. Pt here to get COVID test for COVID exposure. Pt denies sx at this time.

## 2019-10-08 NOTE — Discharge Instructions (Addendum)

## 2019-10-08 NOTE — ED Provider Notes (Signed)
[facility]   MRN: [id number] DOB: [birth year]  Subjective:   Morgan Kirk is a 24 y.o. female presenting for check up on her blood pressure. Has a hx of this found during her incarceration. Was on lisinopril. Was released just last year, refilled lisinopril once more. Has not found a PCP. She is here for COVID 19, had elevated bp. Has unprotected sex, needs to have test before being started on medications.   Denies taking chronic medications.    No Known Allergies  Past Medical History:  Diagnosis Date   Chlamydia    Ovarian cyst    Ovarian cyst      Past Surgical History:  Procedure Laterality Date   NO PAST SURGERIES      Family History  Problem Relation Age of Onset   Cancer Neg Hx    Diabetes Neg Hx    Hypertension Neg Hx     Social History   Tobacco Use   Smoking status: Former Smoker    Types: Cigarettes   Smokeless tobacco: Never Used  Substance Use Topics   Alcohol use: Yes    Comment: occasional   Drug use: Yes    Types: Marijuana    Comment: 3 da;ys ago    ROS   Objective:   Vitals: BP (!) 147/111    Pulse 72    Temp 98.6 F (37 C) (Oral)    Resp 18   BP Readings from Last 3 Encounters:  10/08/19 (!) 147/111  08/20/19 (!) 140/99  08/20/19 (!) 142/101   Physical Exam Constitutional:      General: She is not in acute distress.    Appearance: Normal appearance. She is well-developed. She is not ill-appearing, toxic-appearing or diaphoretic.  HENT:     Head: Normocephalic and atraumatic.     Right Ear: External ear normal.     Left Ear: External ear normal.     Nose: Nose normal.     Mouth/Throat:     Mouth: Mucous membranes are moist.  Eyes:     General: No scleral icterus.       Right eye: No discharge.        Left eye: No discharge.     Extraocular Movements: Extraocular movements intact.     Conjunctiva/sclera: Conjunctivae normal.     Pupils: Pupils are equal, round, and reactive to light.    Cardiovascular:     Rate and Rhythm: Normal rate and regular rhythm.     Pulses: Normal pulses.     Heart sounds: Normal heart sounds. No murmur heard.  No friction rub. No gallop.   Pulmonary:     Effort: Pulmonary effort is normal. No respiratory distress.     Breath sounds: Normal breath sounds. No stridor. No wheezing, rhonchi or rales.  Musculoskeletal:     Right lower leg: No edema.     Left lower leg: No edema.  Skin:    General: Skin is warm and dry.     Findings: No rash.  Neurological:     Mental Status: She is alert and oriented to person, place, and time.     Cranial Nerves: No cranial nerve deficit.     Motor: No weakness.     Coordination: Coordination normal.     Gait: Gait normal.     Deep Tendon Reflexes: Reflexes normal.  Psychiatric:        Mood and Affect: Mood normal.        Behavior: Behavior normal.  Thought Content: Thought content normal.        Judgment: Judgment normal.     Results for orders placed or performed during the hospital encounter of 10/08/19 (from the past 24 hour(s))  POC urine pregnancy     Status: None   Collection Time: 10/08/19 10:03 AM  Result Value Ref Range   Preg Test, Ur NEGATIVE NEGATIVE    Assessment and Plan :   PDMP not reviewed this encounter.  1. Essential hypertension   2. Elevated blood pressure reading     Start lisinopril once daily, follow-up with [facility] to establish care with new PCP.  Reviewed general management of hypertension which included hypertensive friendly diet. Counseled patient on potential for adverse effects with medications prescribed/recommended today, ER and return-to-clinic precautions discussed, patient verbalized understanding.    Wallis Bamberg, PA-C 10/08/19 1006

## 2019-10-09 LAB — SARS CORONAVIRUS 2 (TAT 6-24 HRS): SARS Coronavirus 2: NEGATIVE

## 2020-08-09 ENCOUNTER — Encounter (HOSPITAL_COMMUNITY): Payer: Self-pay | Admitting: Emergency Medicine

## 2020-08-09 ENCOUNTER — Emergency Department (HOSPITAL_COMMUNITY)
Admission: EM | Admit: 2020-08-09 | Discharge: 2020-08-09 | Disposition: A | Discharge status: Home / Self Care | Payer: No Typology Code available for payment source | Attending: Emergency Medicine | Admitting: Emergency Medicine

## 2020-08-09 ENCOUNTER — Other Ambulatory Visit: Payer: Self-pay

## 2020-08-09 ENCOUNTER — Emergency Department (HOSPITAL_COMMUNITY): Payer: No Typology Code available for payment source

## 2020-08-09 DIAGNOSIS — Z87891 Personal history of nicotine dependence: Secondary | ICD-10-CM | POA: Insufficient documentation

## 2020-08-09 DIAGNOSIS — M79662 Pain in left lower leg: Secondary | ICD-10-CM

## 2020-08-09 DIAGNOSIS — Y92410 Unspecified street and highway as the place of occurrence of the external cause: Secondary | ICD-10-CM | POA: Insufficient documentation

## 2020-08-09 DIAGNOSIS — S0990XA Unspecified injury of head, initial encounter: Secondary | ICD-10-CM | POA: Insufficient documentation

## 2020-08-09 DIAGNOSIS — R102 Pelvic and perineal pain: Secondary | ICD-10-CM | POA: Diagnosis not present

## 2020-08-09 DIAGNOSIS — Z23 Encounter for immunization: Secondary | ICD-10-CM | POA: Insufficient documentation

## 2020-08-09 DIAGNOSIS — I10 Essential (primary) hypertension: Secondary | ICD-10-CM

## 2020-08-09 LAB — I-STAT BETA HCG BLOOD, ED (MC, WL, AP ONLY): I-stat hCG, quantitative: <5 m[IU]/mL (ref <5)

## 2020-08-09 MED ORDER — HYDROCHLOROTHIAZIDE 25 MG PO TABS: 25.0000 mg | ORAL_TABLET | Freq: Every day | ORAL | 0 refills | Status: DC

## 2020-08-09 MED ORDER — NAPROXEN 500 MG PO TABS
500.0000 mg | ORAL_TABLET | Freq: Once | ORAL | Status: AC
  Administered 2020-08-09: 500 mg via ORAL
  Filled 2020-08-09: qty 1

## 2020-08-09 MED ORDER — ACETAMINOPHEN 500 MG PO TABS
1000.0000 mg | ORAL_TABLET | Freq: Once | ORAL | Status: DC
  Filled 2020-08-09: qty 2

## 2020-08-09 MED ORDER — TETANUS-DIPHTH-ACELL PERTUSSIS 5-2.5-18.5 LF-MCG/0.5 IM SUSY
0.5000 mL | PREFILLED_SYRINGE | Freq: Once | INTRAMUSCULAR | Status: AC
  Administered 2020-08-09: 0.5 mL via INTRAMUSCULAR
  Filled 2020-08-09: qty 0.5

## 2020-08-09 MED ORDER — LIDOCAINE 5 % EX PTCH: 1.0000 | MEDICATED_PATCH | CUTANEOUS | 0 refills | Status: DC

## 2020-08-09 MED ORDER — NAPROXEN 375 MG PO TABS: 375.0000 mg | ORAL_TABLET | Freq: Two times a day (BID) | ORAL | 0 refills | Status: DC

## 2020-08-09 MED ORDER — LIDOCAINE 5 % EX PTCH
3.0000 | MEDICATED_PATCH | CUTANEOUS | Status: DC
  Administered 2020-08-09: 3 via TRANSDERMAL
  Filled 2020-08-09: qty 3
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
## 2020-10-30 ENCOUNTER — Encounter (HOSPITAL_COMMUNITY): Payer: Self-pay

## 2020-10-30 ENCOUNTER — Other Ambulatory Visit: Payer: Self-pay

## 2020-10-30 ENCOUNTER — Emergency Department (HOSPITAL_COMMUNITY)
Admission: EM | Admit: 2020-10-30 | Discharge: 2020-10-30 | Disposition: A | Discharge status: Home / Self Care | Payer: Medicaid Other | Attending: Emergency Medicine | Admitting: Emergency Medicine

## 2020-10-30 DIAGNOSIS — N9489 Other specified conditions associated with female genital organs and menstrual cycle: Secondary | ICD-10-CM | POA: Insufficient documentation

## 2020-10-30 DIAGNOSIS — Z87891 Personal history of nicotine dependence: Secondary | ICD-10-CM | POA: Insufficient documentation

## 2020-10-30 DIAGNOSIS — Z3202 Encounter for pregnancy test, result negative: Secondary | ICD-10-CM | POA: Insufficient documentation

## 2020-10-30 LAB — HCG, QUANTITATIVE, PREGNANCY: hCG, Beta Chain, Quant, S: <1 m[IU]/mL (ref <5)

## 2020-10-30 NOTE — Discharge Instructions (Addendum)
Your pregnancy test is negative.  Your blood pressure is elevated today, please have it rechecked by your primary care doctor.

## 2020-10-30 NOTE — ED Provider Notes (Signed)
[facility]-EMERGENCY DEPT Provider Note   CSN: [id number] Arrival date & time: 10/30/20  1053     History Chief Complaint  Patient presents with   Possible Pregnancy    Morgan Kirk is a 25 y.o. female.  The history is provided by the patient. No language interpreter was used.  Possible Pregnancy   25 year old female who presents requesting for Pregnancy test .  Patient reports she has never been pregnant in the past.  Her last menstrual period was in August.  She is currently having unprotected sex with her fianc.  She mention she took 2 home pregnancy test in 1 was negative and the other 1 was positive.  She then went to an ED for evaluation and was told that her pregnancy test was negative.  She then had several positive tests at home and today decided to come to the ER for a confirmatory test.  She does endorse some lower abdominal cramping for the past week and has been persistent, mild to moderate in severity.  No fever nausea vomiting dysuria hematuria vaginal bleeding or vaginal discharge.  Last menstrual period was August 15.  Does have history of ovarian cyst.  Past Medical History:  Diagnosis Date   Chlamydia    Ovarian cyst    Ovarian cyst     Patient Active Problem List   Diagnosis Date Noted   History of syphilis 08/20/2019   Pelvic pain 12/14/2016    Past Surgical History:  Procedure Laterality Date   NO PAST SURGERIES       OB History     Gravida  0   Para      Term      Preterm      AB  0   Living  0      SAB  0   IAB      Ectopic      Multiple      Live Births              Family History  Problem Relation Age of Onset   Cancer Neg Hx    Diabetes Neg Hx    Hypertension Neg Hx     Social History   Tobacco Use   Smoking status: Former    Types: Cigarettes   Smokeless tobacco: Never  Substance Use Topics   Alcohol use: Yes    Comment: occasional   Drug use: Yes    Types: Marijuana     Comment: 3 da;ys ago    Home Medications Prior to Admission medications   Medication Sig Start Date End Date Taking? Authorizing Provider  acetaminophen (TYLENOL) 325 MG tablet Take 650 mg by mouth every 6 (six) hours as needed.    [provider]  hydrochlorothiazide (HYDRODIURIL) 25 MG tablet Take 1 tablet (25 mg total) by mouth daily. 08/09/20   Palumbo, April, MD  lidocaine (LIDODERM) 5 % Place 1 patch onto the skin daily. Remove & Discard patch within 12 hours or as directed by MD 08/09/20   Nicanor Alcon, April, MD  lisinopril (ZESTRIL) 10 MG tablet Take 1 tablet (10 mg total) by mouth daily. 10/08/19   Wallis Bamberg, PA-C  naproxen (NAPROSYN) 375 MG tablet Take 1 tablet (375 mg total) by mouth 2 (two) times daily with a meal. 08/09/20   Palumbo, April, MD    Allergies    Patient has no known allergies.  Review of Systems   Review of Systems  All other systems reviewed  and are negative.  Physical Exam Updated Vital Signs BP (!) 163/89   Pulse 80   Temp 98 F (36.7 C) (Oral)   Resp 18   LMP 09/14/2020   SpO2 100%   Physical Exam Vitals and nursing note reviewed.  Constitutional:      General: She is not in acute distress.    Appearance: She is well-developed.  HENT:     Head: Atraumatic.  Eyes:     Conjunctiva/sclera: Conjunctivae normal.  Pulmonary:     Effort: Pulmonary effort is normal.  Abdominal:     Palpations: Abdomen is soft.     Tenderness: There is no abdominal tenderness.  Musculoskeletal:     Cervical back: Neck supple.  Skin:    Findings: No rash.  Neurological:     Mental Status: She is alert.  Psychiatric:        Mood and Affect: Mood normal.    ED Results / Procedures / Treatments   Labs (all labs ordered are listed, but only abnormal results are displayed) Labs Reviewed  HCG, QUANTITATIVE, PREGNANCY    EKG None  Radiology No results found.  Procedures Procedures   Medications Ordered in ED Medications - No data to display  ED  Course  I have reviewed the triage vital signs and the nursing notes.  Pertinent labs & imaging results that were available during my care of the patient were reviewed by me and considered in my medical decision making (see chart for details).    MDM Rules/Calculators/A&P                           BP (!) 163/89   Pulse 80   Temp 98 F (36.7 C) (Oral)   Resp 18   LMP 09/14/2020   SpO2 100%   Final Clinical Impression(s) / ED Diagnoses Final diagnoses:  Negative pregnancy test    Rx / DC Orders ED Discharge Orders     None      Patient with request for pregnancy test as she has several positive pregnancy tests recently.  She is also sexually active not using protection.  No significant abdominal discomfort on initial exam. Quantitative hCG test was obtained today and was negative.  Patient made aware of findings.  She is otherwise stable for discharge.     Fayrene Helper, PA-C 10/30/20 1327    Lorre Nick, MD 11/01/20 [phone]

## 2020-10-30 NOTE — ED Triage Notes (Addendum)
Pt arrived via POV, requesting pregnancy test. States she took two home tests, one negative, one positive, was seen at ED 9/25, told pregnancy test negative, but has had two positive at home tests since.   Last menstrual cycle august 15th

## 2021-02-16 ENCOUNTER — Other Ambulatory Visit: Payer: Self-pay

## 2021-02-16 ENCOUNTER — Encounter (HOSPITAL_COMMUNITY): Payer: Self-pay | Admitting: Emergency Medicine

## 2021-02-16 ENCOUNTER — Ambulatory Visit (HOSPITAL_COMMUNITY)
Admission: EM | Admit: 2021-02-16 | Discharge: 2021-02-16 | Disposition: A | Discharge status: Home / Self Care | Payer: Medicaid Other | Attending: Emergency Medicine | Admitting: Emergency Medicine

## 2021-02-16 DIAGNOSIS — Z202 Contact with and (suspected) exposure to infections with a predominantly sexual mode of transmission: Secondary | ICD-10-CM

## 2021-02-16 DIAGNOSIS — N898 Other specified noninflammatory disorders of vagina: Secondary | ICD-10-CM | POA: Insufficient documentation

## 2021-02-16 DIAGNOSIS — I10 Essential (primary) hypertension: Secondary | ICD-10-CM | POA: Insufficient documentation

## 2021-02-16 DIAGNOSIS — R102 Pelvic and perineal pain: Secondary | ICD-10-CM | POA: Insufficient documentation

## 2021-02-16 LAB — HIV ANTIBODY (ROUTINE TESTING W REFLEX): HIV Screen 4th Generation wRfx: NONREACTIVE

## 2021-02-16 LAB — POCT URINALYSIS DIPSTICK, ED / UC
Bilirubin Urine: NEGATIVE
Glucose, UA: NEGATIVE mg/dL
Hgb urine dipstick: NEGATIVE
Ketones, ur: NEGATIVE mg/dL
Leukocytes,Ua: NEGATIVE
Nitrite: NEGATIVE
Protein, ur: NEGATIVE mg/dL
Specific Gravity, Urine: 1.02 (ref 1.005–1.030)
Urobilinogen, UA: 0.2 mg/dL (ref 0.0–1.0)
pH: 7 (ref 5.0–8.0)

## 2021-02-16 MED ORDER — HYDROCHLOROTHIAZIDE 25 MG PO TABS: 25.0000 mg | ORAL_TABLET | Freq: Every day | ORAL | 1 refills | Status: DC

## 2021-02-16 MED ORDER — PENICILLIN G BENZATHINE 1200000 UNIT/2ML IM SUSY
PREFILLED_SYRINGE | INTRAMUSCULAR | Status: AC
  Filled 2021-02-16: qty 4

## 2021-02-16 MED ORDER — PENICILLIN G BENZATHINE 1200000 UNIT/2ML IM SUSY
2.4000 10*6.[IU] | PREFILLED_SYRINGE | Freq: Once | INTRAMUSCULAR | Status: AC
  Administered 2021-02-16: 2.4 10*6.[IU] via INTRAMUSCULAR

## 2021-02-16 MED ORDER — LISINOPRIL 10 MG PO TABS: 10.0000 mg | ORAL_TABLET | Freq: Every day | ORAL | 1 refills | Status: DC

## 2021-02-16 NOTE — ED Provider Notes (Signed)
[place]    CSN: [id number] Arrival date & time: 02/16/21  [id number]      History   Chief Complaint Chief Complaint  Patient presents with   Hip Pain   Abdominal Pain    HPI Morgan Kirk is a 26 y.o. female.   Patient presents with centralized pelvic pain and lower abdominal pressure occurring intermittently for 2 weeks.  [place]ors that symptoms became constant over the last 3 days worsened with movement and with urination.  Morgan Kirk thick vaginal discharge present for 6 days with associated vaginal irritation.  Denies vaginal itching, odor, dysuria, hematuria, urinary frequency or urgency, flank pain, fever, chills,, nausea, vomiting, diarrhea.  Sexually active, 1 partner, no condom use.  History of chlamydia, ovarian cyst, syphilis, yeast, BV.  Past Medical History:  Diagnosis Date   Chlamydia    Ovarian cyst    Ovarian cyst     Patient Active Problem List   Diagnosis Date Noted   History of syphilis 08/20/2019   Pelvic pain 12/14/2016    Past Surgical History:  Procedure Laterality Date   NO PAST SURGERIES      OB History     Gravida  0   Para      Term      Preterm      AB  0   Living  0      SAB  0   IAB      Ectopic      Multiple      Live Births               Home Medications    Prior to Admission medications   Medication Sig Start Date End Date Taking? Authorizing Provider  acetaminophen (TYLENOL) 325 MG tablet Take 650 mg by mouth every 6 (six) hours as needed.    [provider]  hydrochlorothiazide (HYDRODIURIL) 25 MG tablet Take 1 tablet (25 mg total) by mouth daily. 08/09/20   Palumbo, April, MD  lidocaine (LIDODERM) 5 % Place 1 patch onto the skin daily. Remove & Discard patch within 12 hours or as directed by MD 08/09/20   Randal Buba, April, MD  lisinopril (ZESTRIL) 10 MG tablet Take 1 tablet (10 mg total) by mouth daily. 10/08/19   Jaynee Eagles, PA-C  naproxen (NAPROSYN) 375 MG tablet Take 1 tablet (375 mg total)  by mouth 2 (two) times daily with a meal. 08/09/20   Palumbo, April, MD    Family History Family History  Problem Relation Age of Onset   Cancer Neg Hx    Diabetes Neg Hx    Hypertension Neg Hx     Social History Social History   Tobacco Use   Smoking status: Former    Types: Cigarettes   Smokeless tobacco: Never  Substance Use Topics   Alcohol use: Yes    Comment: occasional   Drug use: Yes    Types: Marijuana    Comment: 3 da;ys ago     Allergies   Patient has no known allergies.   Review of Systems Review of Systems  Respiratory: Negative.    Cardiovascular: Negative.   Gastrointestinal:  Positive for abdominal pain. Negative for abdominal distention, anal bleeding, blood in stool, constipation, diarrhea, nausea, rectal pain and vomiting.  Genitourinary:  Positive for vaginal discharge. Negative for decreased urine volume, difficulty urinating, dyspareunia, dysuria, enuresis, flank pain, frequency, genital sores, hematuria, menstrual problem, pelvic pain, urgency, vaginal bleeding and vaginal pain.  Skin: Negative.   Neurological:  Negative.     Physical Exam Triage Vital Signs ED Triage Vitals  Enc Vitals Group     BP 02/16/21 1007 (!) 154/112     Pulse Rate 02/16/21 1007 85     Resp 02/16/21 1007 17     Temp 02/16/21 1007 98.2 F (36.8 C)     Temp Source 02/16/21 1007 Oral     SpO2 02/16/21 1007 100 %     Weight --      Height --      Head Circumference --      Peak Flow --      Pain Score 02/16/21 1005 10     Pain Loc --      Pain Edu? --      Excl. in [place]? --    No data found.  Updated Vital Signs BP (!) 154/112 (BP Location: Left Arm)    Pulse 85    Temp 98.2 F (36.8 C) (Oral)    Resp 17    SpO2 100%   Visual Acuity Right Eye Distance:   Left Eye Distance:   Bilateral Distance:    Right Eye Near:   Left Eye Near:    Bilateral Near:     Physical Exam Constitutional:      Appearance: She is well-developed and normal weight.   Pulmonary:     Effort: Pulmonary effort is normal.  Abdominal:     General: Abdomen is flat. Bowel sounds are normal.     Palpations: Abdomen is soft.     Tenderness: There is abdominal tenderness in the suprapubic area. There is no right CVA tenderness, left CVA tenderness or guarding.  Skin:    General: Skin is warm and dry.  Neurological:     General: No focal deficit present.     Mental Status: She is alert and oriented to person, place, and time.     UC Treatments / Results  Labs (all labs ordered are listed, but only abnormal results are displayed) Labs Reviewed  POCT URINALYSIS DIPSTICK, ED / UC    EKG   Radiology No results found.  Procedures Procedures (including critical care time)  Medications Ordered in UC Medications - No data to display  Initial Impression / Assessment and Plan / UC Course  I have reviewed the triage vital signs and the nursing notes.  Pertinent labs & imaging results that were available during my care of the patient were reviewed by me and considered in my medical decision making (see chart for details).  Suprapubic pain Vaginal discharge Elevated blood pressure reading in office with diagnosis of hypertension  Urinalysis negative, discussed findings with patient, STI screening pending, will treat per protocol, will treat prophylactically for syphilis, 2,400,000 units of penicillin G given as patient tested positive when she had similar symptoms as stated above in 2021, advised abstinence for the next 7 days, until labs results and all symptoms have cleared, recommended use of condoms with all sexual encounters moving forward  Refilled blood pressure medication for 69-month supply, given information to help establish PCP for further management, given signs of hypertensive urgency and when to follow-up at emergency department, verbalized understanding Final Clinical Impressions(s) / UC Diagnoses   Final diagnoses:  None   Discharge  Instructions   None    ED Prescriptions   None    PDMP not reviewed this encounter.   Hans Eden, NP 02/16/21 1041

## 2021-02-16 NOTE — Discharge Instructions (Addendum)
Today you are being treated prophylactically for syphilis, based on a review of your chart in 2021 you had symptoms similar to the ones stated today and you are positive for syphilis at that time, please do not have sex for the next 7 days  Your urinalysis was negative for infection  Labs pending 2-3 days, you will be contacted if positive for any sti and treatment will be sent to the pharmacy, you will have to return to the clinic if positive for gonorrhea to receive treatment   Please refrain from having sex until labs results, if positive please refrain from having sex until treatment complete and symptoms resolve   If positive for HIV, Syphilis, Chlamydia  gonorrhea or trichomoniasis please notify partner or partners so they may tested as well  Moving forward, it is recommended you use some form of protection against the transmission of sti infections  such as condoms or dental dams with each sexual encounter     Your blood pressure medications have been refilled for a 68-month supply, please attempt to find a primary doctor for further management  If you begin to have shortness of breath, chest pain or tightness, dizziness, lightheadedness, blurred vision while having elevated blood pressure please go to the nearest emergency department for further evaluation

## 2021-02-16 NOTE — ED Triage Notes (Signed)
Pt c/o pelvic and abd pains that have been intermittent for 2 weeks. C/o Dysuria. Denies bowel problems, n/v.

## 2021-02-17 ENCOUNTER — Telehealth (HOSPITAL_COMMUNITY): Payer: Self-pay | Admitting: Emergency Medicine

## 2021-02-17 LAB — CERVICOVAGINAL ANCILLARY ONLY
Bacterial Vaginitis (gardnerella): POSITIVE — AB
Candida Glabrata: NEGATIVE
Candida Vaginitis: NEGATIVE
Chlamydia: NEGATIVE
Comment: NEGATIVE
Comment: NEGATIVE
Comment: NEGATIVE
Comment: NEGATIVE
Comment: NEGATIVE
Comment: NORMAL
Neisseria Gonorrhea: NEGATIVE
Trichomonas: NEGATIVE

## 2021-02-17 MED ORDER — METRONIDAZOLE 500 MG PO TABS: 500.0000 mg | ORAL_TABLET | Freq: Two times a day (BID) | ORAL | 0 refills | Status: DC

## 2021-02-18 LAB — RPR: RPR Ser Ql: REACTIVE — AB

## 2021-02-18 LAB — T.PALLIDUM AB, TOTAL: T Pallidum Abs: REACTIVE — AB

## 2021-11-24 ENCOUNTER — Ambulatory Visit
Admission: EM | Admit: 2021-11-24 | Discharge: 2021-11-24 | Disposition: A | Discharge status: Home / Self Care | Payer: Self-pay | Attending: Physician Assistant | Admitting: Physician Assistant

## 2021-11-24 DIAGNOSIS — N912 Amenorrhea, unspecified: Secondary | ICD-10-CM

## 2021-11-24 DIAGNOSIS — N760 Acute vaginitis: Secondary | ICD-10-CM

## 2021-11-24 DIAGNOSIS — I10 Essential (primary) hypertension: Secondary | ICD-10-CM

## 2021-11-24 LAB — POCT URINALYSIS DIP (MANUAL ENTRY)
Bilirubin, UA: NEGATIVE
Blood, UA: NEGATIVE
Glucose, UA: NEGATIVE mg/dL
Ketones, POC UA: NEGATIVE mg/dL
Nitrite, UA: NEGATIVE
Protein Ur, POC: 30 mg/dL — AB
Spec Grav, UA: 1.025 (ref 1.010–1.025)
Urobilinogen, UA: 0.2 E.U./dL
pH, UA: 6.5 (ref 5.0–8.0)

## 2021-11-24 LAB — POCT URINE PREGNANCY: Preg Test, Ur: NEGATIVE

## 2021-11-24 MED ORDER — LISINOPRIL 10 MG PO TABS: 10.0000 mg | ORAL_TABLET | Freq: Every day | ORAL | 1 refills | Status: DC

## 2021-11-24 MED ORDER — HYDROCHLOROTHIAZIDE 25 MG PO TABS: 25.0000 mg | ORAL_TABLET | Freq: Every day | ORAL | 1 refills | Status: DC

## 2021-11-24 NOTE — Discharge Instructions (Addendum)
Labs will be completed in 48-72 hours.  If you do not get a call from this office that indicates the labs are negative.  Logged onto MyChart to view the test results when they post in 24 to 48 hours. Advised to restart taking the hydrochlorothiazide 25 mg 1 in the morning and the lisinopril 10 mg 1 in the morning to help control blood pressure. Advised to follow-up with PCP or return to urgent care if symptoms fail to improve.

## 2021-11-24 NOTE — ED Provider Notes (Signed)
[facility]    CSN: [id number] Arrival date & time: 11/24/21  1204      History   Chief Complaint Chief Complaint  Patient presents with   Abdominal Pain    HPI MACELYN ZULLI is a 26 y.o. female.   26 year old female presents with high blood pressure, vaginal discharge, and desires pregnancy test.  Patient indicates that she ran out of her blood pressure medicine several days ago.  He indicates that she noticed her blood pressure increasing because she has been having frequent headaches occurring due to the blood pressure elevation.  She relates that her headaches are sharp, all over the head, throbbing in nature.  She indicates she does get relief with taking BC powders.  She denies any nausea or vomiting.  Patient request to have a refill of her blood pressure medications and also she relates she got better control when she was taking the fluid pill along with the lisinopril. Patient also indicates that for the past several days she has been having vaginal discharge, white and thick, with odor.  She indicates that she noticed the discharge start after having intercourse that was unprotected with her significant other about a week ago.  She denies having any urinary symptoms such as frequency, urgency, or dysuria. Patient desires a pregnancy test as she is sexually active and not using protection or precautions.  She indicates that her last period was at the beginning of September.  She indicates she does have irregular periods and usually will have only a couple of periods during the year.  She has been seen by OB/GYN for her abnormal cycling.   Abdominal Pain   Past Medical History:  Diagnosis Date   Chlamydia    Ovarian cyst    Ovarian cyst     Patient Active Problem List   Diagnosis Date Noted   History of syphilis 08/20/2019   Pelvic pain 12/14/2016    Past Surgical History:  Procedure Laterality Date   NO PAST SURGERIES      OB History      Gravida  0   Para      Term      Preterm      AB  0   Living  0      SAB  0   IAB      Ectopic      Multiple      Live Births               Home Medications    Prior to Admission medications   Medication Sig Start Date End Date Taking? Authorizing Provider  acetaminophen (TYLENOL) 325 MG tablet Take 650 mg by mouth every 6 (six) hours as needed.    [provider]  hydrochlorothiazide (HYDRODIURIL) 25 MG tablet Take 1 tablet (25 mg total) by mouth daily. 11/24/21   Nyoka Lint, PA-C  lisinopril (ZESTRIL) 10 MG tablet Take 1 tablet (10 mg total) by mouth daily. 11/24/21   Nyoka Lint, PA-C  metroNIDAZOLE (FLAGYL) 500 MG tablet Take 1 tablet (500 mg total) by mouth 2 (two) times daily. 02/17/21   LampteyMyrene Galas, MD    Family History Family History  Problem Relation Age of Onset   Cancer Neg Hx    Diabetes Neg Hx    Hypertension Neg Hx     Social History Social History   Tobacco Use   Smoking status: Former    Types: Cigarettes   Smokeless tobacco: Never  Substance Use Topics   Alcohol use: Yes    Comment: occasional   Drug use: Yes    Types: Marijuana    Comment: 3 da;ys ago     Allergies   Patient has no known allergies.   Review of Systems Review of Systems  Gastrointestinal:  Positive for abdominal pain (right lower ovary area.).     Physical Exam Triage Vital Signs ED Triage Vitals  Enc Vitals Group     BP 11/24/21 1236 (!) 154/104     Pulse Rate 11/24/21 1236 80     Resp 11/24/21 1236 16     Temp 11/24/21 1236 99.3 F (37.4 C)     Temp Source 11/24/21 1236 Oral     SpO2 11/24/21 1236 98 %     Weight --      Height --      Head Circumference --      Peak Flow --      Pain Score 11/24/21 1237 6     Pain Loc --      Pain Edu? --      Excl. in [place]? --    No data found.  Updated Vital Signs BP (!) 154/104 (BP Location: Right Arm)   Pulse 80   Temp 99.3 F (37.4 C) (Oral)   Resp 16   SpO2 98%    Breastfeeding No   Visual Acuity Right Eye Distance:   Left Eye Distance:   Bilateral Distance:    Right Eye Near:   Left Eye Near:    Bilateral Near:     Physical Exam Constitutional:      Appearance: She is well-developed.  HENT:     Right Ear: Tympanic membrane normal.     Left Ear: Tympanic membrane normal.     Mouth/Throat:     Mouth: Mucous membranes are moist.     Pharynx: Oropharynx is clear.  Cardiovascular:     Rate and Rhythm: Normal rate and regular rhythm.     Heart sounds: Normal heart sounds.  Pulmonary:     Effort: Pulmonary effort is normal.     Breath sounds: Normal breath sounds and air entry. No wheezing, rhonchi or rales.  Abdominal:     General: Abdomen is flat. Bowel sounds are normal.     Palpations: Abdomen is soft.     Tenderness: There is abdominal tenderness in the right lower quadrant.       Comments: Abdomen: Is palpated along the right ovarian area without guarding.  Lymphadenopathy:     Cervical: No cervical adenopathy.  Neurological:     Mental Status: She is alert.      UC Treatments / Results  Labs (all labs ordered are listed, but only abnormal results are displayed) Labs Reviewed  POCT URINALYSIS DIP (MANUAL ENTRY) - Abnormal; Notable for the following components:      Result Value   Clarity, UA cloudy (*)    Protein Ur, POC =30 (*)    Leukocytes, UA Trace (*)    All other components within normal limits  POCT URINE PREGNANCY  CERVICOVAGINAL ANCILLARY ONLY    EKG   Radiology No results found.  Procedures Procedures (including critical care time)  Medications Ordered in UC Medications - No data to display  Initial Impression / Assessment and Plan / UC Course  I have reviewed the triage vital signs and the nursing notes.  Pertinent labs & imaging results that were available during my care of the patient were  reviewed by me and considered in my medical decision making (see chart for details).    Plan: 1.  The  uncontrolled essential hypertension will be treated with the following: A.  Lisinopril 10 mg, 1 tablet daily to help control blood pressure. B.  Hydrochlorothiazide 25 mg, 1 tablet in the morning to help control blood pressure. 2.  The vaginitis will be treated with the following: A.  STI testing results are pending. B.  The vaginitis will be treated depending on the STI testing results. 3.  The amenorrhea will be treated with the following: A.  Pregnancy test was negative. B.  Advised the patient to observe watchful waiting and to follow-up with OB/GYN if no menses occurs within the next 2 to 3 weeks. 4.  Advised to follow-up with PCP about blood pressure control, return to urgent care as needed. Final Clinical Impressions(s) / UC Diagnoses   Final diagnoses:  Amenorrhea  Vaginitis and vulvovaginitis  Essential hypertension     Discharge Instructions      Labs will be completed in 48-72 hours.  If you do not get a call from this office that indicates the labs are negative.  Logged onto MyChart to view the test results when they post in 24 to 48 hours. Advised to restart taking the hydrochlorothiazide 25 mg 1 in the morning and the lisinopril 10 mg 1 in the morning to help control blood pressure. Advised to follow-up with PCP or return to urgent care if symptoms fail to improve.    ED Prescriptions     Medication Sig Dispense Auth. Provider   hydrochlorothiazide (HYDRODIURIL) 25 MG tablet Take 1 tablet (25 mg total) by mouth daily. 90 tablet Nyoka Lint, PA-C   lisinopril (ZESTRIL) 10 MG tablet Take 1 tablet (10 mg total) by mouth daily. 90 tablet Nyoka Lint, PA-C      PDMP not reviewed this encounter.   Nyoka Lint, PA-C 11/24/21 1309

## 2021-11-24 NOTE — ED Triage Notes (Signed)
Pt c/o headache x 2 days concerned for elevated bp  Also c/o white thick vaginal discharge and pelvic pain onset ~ 1-2 weeks ago.   Requesting u-preg

## 2021-11-25 ENCOUNTER — Telehealth (HOSPITAL_COMMUNITY): Payer: Self-pay | Admitting: Emergency Medicine

## 2021-11-25 LAB — CERVICOVAGINAL ANCILLARY ONLY
Bacterial Vaginitis (gardnerella): POSITIVE — AB
Candida Glabrata: NEGATIVE
Candida Vaginitis: NEGATIVE
Chlamydia: NEGATIVE
Comment: NEGATIVE
Comment: NEGATIVE
Comment: NEGATIVE
Comment: NEGATIVE
Comment: NEGATIVE
Comment: NORMAL
Neisseria Gonorrhea: NEGATIVE
Trichomonas: NEGATIVE

## 2021-11-25 MED ORDER — METRONIDAZOLE 500 MG PO TABS: 500.0000 mg | ORAL_TABLET | Freq: Two times a day (BID) | ORAL | 0 refills | Status: AC

## 2022-09-01 ENCOUNTER — Ambulatory Visit (HOSPITAL_COMMUNITY)
Admission: EM | Admit: 2022-09-01 | Discharge: 2022-09-01 | Disposition: A | Discharge status: Home / Self Care | Payer: Medicaid Other | Attending: Family Medicine | Admitting: Family Medicine

## 2022-09-01 ENCOUNTER — Encounter (HOSPITAL_COMMUNITY): Payer: Self-pay

## 2022-09-01 DIAGNOSIS — G8929 Other chronic pain: Secondary | ICD-10-CM | POA: Insufficient documentation

## 2022-09-01 DIAGNOSIS — R102 Pelvic and perineal pain: Secondary | ICD-10-CM | POA: Insufficient documentation

## 2022-09-01 DIAGNOSIS — I10 Essential (primary) hypertension: Secondary | ICD-10-CM | POA: Diagnosis present

## 2022-09-01 LAB — POCT URINALYSIS DIP (MANUAL ENTRY)
Bilirubin, UA: NEGATIVE
Blood, UA: NEGATIVE
Glucose, UA: NEGATIVE mg/dL
Ketones, POC UA: NEGATIVE mg/dL
Leukocytes, UA: NEGATIVE
Nitrite, UA: NEGATIVE
Protein Ur, POC: NEGATIVE mg/dL
Spec Grav, UA: 1.025 (ref 1.010–1.025)
Urobilinogen, UA: 0.2 E.U./dL
pH, UA: 5.5 (ref 5.0–8.0)

## 2022-09-01 LAB — POCT URINE PREGNANCY: Preg Test, Ur: NEGATIVE

## 2022-09-01 MED ORDER — HYDROCHLOROTHIAZIDE 25 MG PO TABS: 25.0000 mg | ORAL_TABLET | Freq: Every day | ORAL | 0 refills | Status: DC

## 2022-09-01 MED ORDER — LISINOPRIL 10 MG PO TABS: 10.0000 mg | ORAL_TABLET | Freq: Every day | ORAL | 0 refills | Status: DC

## 2022-09-01 NOTE — ED Triage Notes (Signed)
Pt reports her BP has been high. Lower back,pelvic and stomach pain. Going on for a week. Frequent urination. Small amounts of urine. Pelvic pain is 8/10, not sexually active. Hx of hypertension. Out of meds.

## 2022-09-01 NOTE — ED Provider Notes (Signed)
[facility]    CSN: [id number] Arrival date & time: 09/01/22  1318      History   Chief Complaint No chief complaint on file.   HPI Morgan Kirk is a 27 y.o. female.   HPI STD Screening Patient here today for routine STD screening.  Currently asymptomatic. Denies any concerns of pregnancy.Patient is currently sexually active with a new partner and  would like routine STD screening. She has chronic pelvic pain. Concern for possible UTI due to urine frequency.  Hypertension Patient out of medications for a few months.  Patient does not have a primary care provider and has been utilizing urgent care to receive refills of blood pressure medications. On arrival patient's blood pressure is elevated.  Reports she has noticed intermittent swelling in her bilateral lower extremities. Past Medical History:  Diagnosis Date   Chlamydia    Ovarian cyst    Ovarian cyst     Patient Active Problem List   Diagnosis Date Noted   History of syphilis 08/20/2019   Pelvic pain 12/14/2016    Past Surgical History:  Procedure Laterality Date   NO PAST SURGERIES      OB History     Gravida  0   Para      Term      Preterm      AB  0   Living  0      SAB  0   IAB      Ectopic      Multiple      Live Births               Home Medications    Prior to Admission medications   Medication Sig Start Date End Date Taking? Authorizing Provider  acetaminophen (TYLENOL) 325 MG tablet Take 650 mg by mouth every 6 (six) hours as needed.    [provider]  hydrochlorothiazide (HYDRODIURIL) 25 MG tablet Take 1 tablet (25 mg total) by mouth daily. 09/01/22   Bing Neighbors, NP  lisinopril (ZESTRIL) 10 MG tablet Take 1 tablet (10 mg total) by mouth daily. 09/01/22   Bing Neighbors, NP  metroNIDAZOLE (FLAGYL) 500 MG tablet Take 1 tablet (500 mg total) by mouth 2 (two) times daily. 11/25/21   LampteyBritta Mccreedy, MD    Family History Family History   Problem Relation Age of Onset   Cancer Neg Hx    Diabetes Neg Hx    Hypertension Neg Hx     Social History Social History   Tobacco Use   Smoking status: Former    Types: Cigarettes   Smokeless tobacco: Never  Substance Use Topics   Alcohol use: Yes    Comment: occasional   Drug use: Yes    Types: Marijuana    Comment: 3 da;ys ago     Allergies   Patient has no known allergies.   Review of Systems Review of Systems Pertinent negatives listed in HPI   Physical Exam Triage Vital Signs ED Triage Vitals  Encounter Vitals Group     BP 09/01/22 1518 (!) 162/126     Systolic BP Percentile --      Diastolic BP Percentile --      Pulse Rate 09/01/22 1518 81     Resp 09/01/22 1518 16     Temp 09/01/22 1518 98.3 F (36.8 C)     Temp Source 09/01/22 1518 Oral     SpO2 09/01/22 1518 97 %  Weight 09/01/22 1516 202 lb (91.6 kg)     Height --      Head Circumference --      Peak Flow --      Pain Score 09/01/22 1515 8     Pain Loc --      Pain Education --      Exclude from Growth Chart --    No data found.  Updated Vital Signs BP (!) 162/126 (BP Location: Right Arm)   Pulse 81   Temp 98.3 F (36.8 C) (Oral)   Resp 16   Wt 202 lb (91.6 kg)   LMP 08/22/2022 (Exact Date)   SpO2 97%   BMI 33.61 kg/m   Visual Acuity Right Eye Distance:   Left Eye Distance:   Bilateral Distance:    Right Eye Near:   Left Eye Near:    Bilateral Near:     Physical Exam General appearance: Alert, well developed, well nourished, cooperative  Head: Normocephalic, without obvious abnormality, atraumatic Heart: rate and rhythm normal.  Respiratory: Respirations even and unlabored, normal respiratory rate Extremities: No gross deformities Skin: Skin color, texture, turgor normal. No rashes seen  Psych: Appropriate mood and affect. UC Treatments / Results  Labs (all labs ordered are listed, but only abnormal results are displayed) Labs Reviewed  URINE CULTURE  POCT URINE  PREGNANCY  POCT URINALYSIS DIP (MANUAL ENTRY)  CERVICOVAGINAL ANCILLARY ONLY    EKG   Radiology No results found.  Procedures Procedures (including critical care time)  Medications Ordered in UC Medications - No data to display  Initial Impression / Assessment and Plan / UC Course  I have reviewed the triage vital signs and the nursing notes.  Pertinent labs & imaging results that were available during my care of the patient were reviewed by me and considered in my medical decision making (see chart for details).    Chronic Pelvic Pain, Cytology pending to rule out STD as source of pain. UA trace leukocytes . Will culture urine to rule out infection. HTN, poorly controlled, out of medicine for few months. Refilled  hydrochlorothiazide  and Lisinopril with enough of a supply to last to PCP appointment. Patient advised to follow-up with primary care appointment as our office will not be able to continue to refill hypertensive meds. Final Clinical Impressions(s) / UC Diagnoses   Final diagnoses:  Chronic pelvic pain in female  Essential hypertension     Discharge Instructions      You have been scheduled with a primary care provider, please go to appointment as you have chronic conditions that warrant close follow-up with a primary care provider. You have been prescribed enough medication to last until your scheduled primary care appointment.     ED Prescriptions     Medication Sig Dispense Auth. Provider   lisinopril (ZESTRIL) 10 MG tablet Take 1 tablet (10 mg total) by mouth daily. 60 tablet Bing Neighbors, NP   hydrochlorothiazide (HYDRODIURIL) 25 MG tablet Take 1 tablet (25 mg total) by mouth daily. 60 tablet Bing Neighbors, NP      PDMP not reviewed this encounter.   Bing Neighbors, NP 09/01/22 [phone]

## 2022-09-01 NOTE — Discharge Instructions (Addendum)
You have been scheduled with a primary care provider, please go to appointment as you have chronic conditions that warrant close follow-up with a primary care provider. You have been prescribed enough medication to last until your scheduled primary care appointment.

## 2022-09-02 ENCOUNTER — Telehealth: Payer: Self-pay

## 2022-09-02 MED ORDER — METRONIDAZOLE 500 MG PO TABS: 500.0000 mg | ORAL_TABLET | Freq: Two times a day (BID) | ORAL | 0 refills | Status: AC

## 2022-09-02 NOTE — Telephone Encounter (Signed)
Per protocol, pt requires tx with metronidazole. Reviewed with patient, verified pharmacy, prescription sent.

## 2022-09-22 ENCOUNTER — Telehealth: Payer: Self-pay

## 2022-09-22 ENCOUNTER — Ambulatory Visit: Payer: Medicaid Other | Admitting: Internal Medicine

## 2022-09-22 NOTE — Telephone Encounter (Signed)
Pt was a no show for a NP appt with Salvatore Decent on 09/22/22, I dismissed, I did not send a letter.
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
## 2023-03-14 ENCOUNTER — Ambulatory Visit
Admission: EM | Admit: 2023-03-14 | Discharge: 2023-03-14 | Disposition: A | Discharge status: Home / Self Care | Payer: Medicaid Other | Attending: Physician Assistant | Admitting: Physician Assistant

## 2023-03-14 DIAGNOSIS — I10 Essential (primary) hypertension: Secondary | ICD-10-CM

## 2023-03-14 HISTORY — DX: Essential (primary) hypertension: I10

## 2023-03-14 LAB — POC COVID19/FLU A&B COMBO
Covid Antigen, POC: NEGATIVE
Influenza A Antigen, POC: NEGATIVE
Influenza B Antigen, POC: NEGATIVE

## 2023-03-14 MED ORDER — LISINOPRIL 10 MG PO TABS: 10.0000 mg | ORAL_TABLET | Freq: Every day | ORAL | 0 refills | Status: AC

## 2023-03-14 MED ORDER — HYDROCHLOROTHIAZIDE 25 MG PO TABS: 25.0000 mg | ORAL_TABLET | Freq: Every day | ORAL | 0 refills | Status: AC

## 2023-03-14 NOTE — ED Notes (Signed)
Verbal Order (Note): Provider unable to access a computer creating unreasonable inconvenience to the ordering provider with possible delay in patient care. Acknowledged by Arlys John, Provider and Nurse. Repeated Verbal order by Arlys John, Provider and Nurse.

## 2023-03-14 NOTE — ED Provider Notes (Signed)
[facility]    CSN: [id number] Arrival date & time: 03/14/23  1049      History   Chief Complaint Chief Complaint  Patient presents with   Pain    HPI Morgan Kirk is a 28 y.o. female.   Here today for evaluation of elevated blood pressure.  She reports that she has not had her medication for the last few nights and has noticed elevated blood pressure.  She also states that she has had some occasional chest discomfort.  She denies any shortness of breath or diaphoresis with this.  She has not any lightheadedness.  She does note that she was coughing since yesterday.  She has not any fever.  She was taking lisinopril and hydrochlorothiazide previously without any side effects.  She has upcoming appointment to establish care with primary care in April.  The history is provided by the patient.    Past Medical History:  Diagnosis Date   Chlamydia    Hypertension    Ovarian cyst    Ovarian cyst     Patient Active Problem List   Diagnosis Date Noted   Essential hypertension 03/14/2023   History of syphilis 08/20/2019   Pelvic pain 12/14/2016    Past Surgical History:  Procedure Laterality Date   NO PAST SURGERIES      OB History     Gravida  0   Para      Term      Preterm      AB  0   Living  0      SAB  0   IAB      Ectopic      Multiple      Live Births               Home Medications    Prior to Admission medications   Medication Sig Start Date End Date Taking? Authorizing Provider  acetaminophen (TYLENOL) 325 MG tablet Take 650 mg by mouth every 6 (six) hours as needed.    [provider]  hydrochlorothiazide (HYDRODIURIL) 25 MG tablet Take 1 tablet (25 mg total) by mouth daily. 03/14/23   Tomi Bamberger, PA-C  lisinopril (ZESTRIL) 10 MG tablet Take 1 tablet (10 mg total) by mouth daily. 03/14/23   Tomi Bamberger, PA-C  metroNIDAZOLE (FLAGYL) 500 MG tablet Take 1 tablet (500 mg total) by mouth 2 (two) times  daily. 11/25/21   LampteyBritta Mccreedy, MD    Family History Family History  Problem Relation Age of Onset   Hypertension Maternal Grandmother    Cancer Neg Hx    Diabetes Neg Hx     Social History Social History   Tobacco Use   Smoking status: Former    Types: Cigarettes   Smokeless tobacco: Never  Vaping Use   Vaping status: Never Used  Substance Use Topics   Alcohol use: Yes    Comment: occasional   Drug use: Yes    Frequency: 3.0 times per week    Types: Marijuana     Allergies   Patient has no known allergies.   Review of Systems Review of Systems  Constitutional:  Negative for chills and fever.  Eyes:  Negative for discharge and redness.  Respiratory:  Positive for cough. Negative for shortness of breath.   Cardiovascular:  Positive for chest pain.  Gastrointestinal:  Negative for abdominal pain, nausea and vomiting.  Neurological:  Negative for light-headedness.     Physical Exam Triage  Vital Signs ED Triage Vitals  Encounter Vitals Group     BP 03/14/23 1107 (!) 148/92     Systolic BP Percentile --      Diastolic BP Percentile --      Pulse Rate 03/14/23 1107 99     Resp 03/14/23 1107 18     Temp 03/14/23 1107 99.2 F (37.3 C)     Temp Source 03/14/23 1107 Oral     SpO2 03/14/23 1107 99 %     Weight 03/14/23 1102 201 lb 15.1 oz (91.6 kg)     Height 03/14/23 1102 5\' 5"  (1.651 m)     Head Circumference --      Peak Flow --      Pain Score 03/14/23 1059 0     Pain Loc --      Pain Education --      Exclude from Growth Chart --    No data found.  Updated Vital Signs BP (!) 148/92 (BP Location: Left Arm)   Pulse 99   Temp 99.2 F (37.3 C) (Oral)   Resp 18   Ht 5\' 5"  (1.651 m)   Wt 201 lb 15.1 oz (91.6 kg)   LMP 02/10/2023 (Approximate)   SpO2 99%   BMI 33.60 kg/m   Visual Acuity Right Eye Distance:   Left Eye Distance:   Bilateral Distance:    Right Eye Near:   Left Eye Near:    Bilateral Near:     Physical Exam Vitals and  nursing note reviewed.  Constitutional:      General: She is not in acute distress.    Appearance: Normal appearance. She is not ill-appearing.  HENT:     Head: Normocephalic and atraumatic.  Eyes:     Conjunctiva/sclera: Conjunctivae normal.  Cardiovascular:     Rate and Rhythm: Normal rate and regular rhythm.  Pulmonary:     Effort: Pulmonary effort is normal. No respiratory distress.     Breath sounds: Normal breath sounds. No wheezing, rhonchi or rales.  Musculoskeletal:     Right lower leg: No edema.     Left lower leg: No edema.  Neurological:     Mental Status: She is alert.  Psychiatric:        Mood and Affect: Mood normal.        Behavior: Behavior normal.        Thought Content: Thought content normal.      UC Treatments / Results  Labs (all labs ordered are listed, but only abnormal results are displayed) Labs Reviewed  POC COVID19/FLU A&B COMBO - Normal    EKG   Radiology No results found.  Procedures Procedures (including critical care time)  Medications Ordered in UC Medications - No data to display  Initial Impression / Assessment and Plan / UC Course  I have reviewed the triage vital signs and the nursing notes.  Pertinent labs & imaging results that were available during my care of the patient were reviewed by me and considered in my medical decision making (see chart for details).    Lisinopril and hydrochlorothiazide refilled at prior dosage.  Recommended she start 1 medication and take for a week before adding second medication.  Encouraged her to monitor blood pressure and record for follow-up with primary care.  Recommended follow-up with any concerns.  Discussed further evaluation in the emergency department should chest pain worsen or fail to resolve.  EKG in office today without acute findings, and low suspicion for cardiac  issues given age and lack of other risk factors.  COVID and flu screening ordered given reported cough but negative.   Possible viral etiology of upper respiratory infection developing.  Advised follow-up with any concerns.  Final Clinical Impressions(s) / UC Diagnoses   Final diagnoses:  Essential hypertension   Discharge Instructions   None    ED Prescriptions     Medication Sig Dispense Auth. Provider   lisinopril (ZESTRIL) 10 MG tablet Take 1 tablet (10 mg total) by mouth daily. 90 tablet Erma Pinto F, PA-C   hydrochlorothiazide (HYDRODIURIL) 25 MG tablet Take 1 tablet (25 mg total) by mouth daily. 90 tablet Tomi Bamberger, PA-C      PDMP not reviewed this encounter.   Tomi Bamberger, PA-C 03/14/23 [phone]

## 2023-03-14 NOTE — ED Triage Notes (Signed)
"  I have high BP and have been without my meds for 2-3 nights, for the last 2-3 nights I am having chest pain randomly". "I have been coughing a lot since yesterday". No fever.

## 2023-05-20 ENCOUNTER — Ambulatory Visit
Admission: EM | Admit: 2023-05-20 | Discharge: 2023-05-20 | Disposition: A | Discharge status: Home / Self Care | Attending: Family Medicine | Admitting: Family Medicine

## 2023-05-20 DIAGNOSIS — I10 Essential (primary) hypertension: Secondary | ICD-10-CM | POA: Diagnosis present

## 2023-05-20 DIAGNOSIS — L02412 Cutaneous abscess of left axilla: Secondary | ICD-10-CM | POA: Insufficient documentation

## 2023-05-20 DIAGNOSIS — Z113 Encounter for screening for infections with a predominantly sexual mode of transmission: Secondary | ICD-10-CM | POA: Diagnosis present

## 2023-05-20 MED ORDER — DOXYCYCLINE HYCLATE 100 MG PO CAPS: 100.0000 mg | ORAL_CAPSULE | Freq: Two times a day (BID) | ORAL | 0 refills | Status: AC

## 2023-05-20 MED ORDER — HYDROCODONE-ACETAMINOPHEN 5-325 MG PO TABS
1.0000 | ORAL_TABLET | Freq: Four times a day (QID) | ORAL | 0 refills | Status: AC | PRN
Start: 2023-05-20 — End: ?

## 2023-05-20 NOTE — ED Triage Notes (Signed)
 Pt presents wanting STD testing, including blood work. Pt also c/o boil on armpit on left arm. Sxs onset 2 days ago.

## 2023-05-20 NOTE — Discharge Instructions (Addendum)
 We have sent testing for sexually transmitted infections. We will notify you of any positive results once they are received. If required, we will prescribe any medications you might need.  Please refrain from all sexual activity for at least the next seven days.  Your blood pressure was noted to be elevated during your visit today. If you are currently taking medication for high blood pressure, please ensure you are taking this as directed. If you do not have a history of high blood pressure and your blood pressure remains persistently elevated, you may need to begin taking a medication at some point. You may return here within the next few days to recheck if unable to see your primary care provider or if you do not have a one.  BP (!) 158/111 (BP Location: Left Arm)   Pulse 80   Temp 98 F (36.7 C) (Oral)   Resp 20   LMP 05/02/2023 (Approximate)   SpO2 97%   BP Readings from Last 3 Encounters:  05/20/23 (!) 158/111  03/14/23 (!) 148/92  09/01/22 (!) 162/126

## 2023-05-21 LAB — HIV ANTIBODY (ROUTINE TESTING W REFLEX): HIV Screen 4th Generation wRfx: NONREACTIVE

## 2023-05-21 LAB — SYPHILIS: RPR W/REFLEX TO RPR TITER AND TREPONEMAL ANTIBODIES, TRADITIONAL SCREENING AND DIAGNOSIS ALGORITHM: RPR Ser Ql: NONREACTIVE

## 2023-05-22 LAB — CERVICOVAGINAL ANCILLARY ONLY
Bacterial Vaginitis (gardnerella): POSITIVE — AB
Candida Glabrata: NEGATIVE
Candida Vaginitis: NEGATIVE
Chlamydia: NEGATIVE
Comment: NEGATIVE
Comment: NEGATIVE
Comment: NEGATIVE
Comment: NEGATIVE
Comment: NEGATIVE
Comment: NORMAL
Neisseria Gonorrhea: NEGATIVE
Trichomonas: NEGATIVE

## 2023-05-22 NOTE — ED Provider Notes (Signed)
 [facility]   [id number] 05/20/23 Arrival Time: 1155  ASSESSMENT & PLAN:  1. Screening for STDs (sexually transmitted diseases)   2. Elevated systolic blood pressure reading with diagnosis of hypertension   3. Abscess of left axilla    Incision and Drainage Procedure Note  Anesthesia: 2% plain lidocaine   Procedure Details  The procedure, risks and complications have been discussed in detail (including, but not limited to pain and bleeding) with the patient.  The skin induration was prepped and draped in the usual fashion. After adequate local anesthesia, I&D with a #11 blade was performed on the left axila with copious, bloody, purulent drainage.  EBL: minimal Drains: none Packing: 1/4" iodoform Condition: Tolerated procedure well Complications: none.  Meds ordered this encounter  Medications   doxycycline  (VIBRAMYCIN ) 100 MG capsule    Sig: Take 1 capsule (100 mg total) by mouth 2 (two) times daily.    Dispense:  14 capsule    Refill:  0   HYDROcodone -acetaminophen  (NORCO/VICODIN) 5-325 MG tablet    Sig: Take 1 tablet by mouth every 6 (six) hours as needed for moderate pain (pain score 4-6) or severe pain (pain score 7-10).    Dispense:  8 tablet    Refill:  0   Wound care instructions discussed and given in written format. To return in 48 hours for wound check. [place] Controlled Substances Registry consulted for this patient. I feel the risk/benefit ratio today is favorable for proceeding with this prescription for a controlled substance. Medication sedation precautions given. Finish all antibiotics. OTC analgesics as needed.    Discharge Instructions      We have sent testing for sexually transmitted infections. We will notify you of any positive results once they are received. If required, we will prescribe any medications you might need.  Please refrain from all sexual activity for at least the next seven days.  Your blood pressure was noted to be  elevated during your visit today. If you are currently taking medication for high blood pressure, please ensure you are taking this as directed. If you do not have a history of high blood pressure and your blood pressure remains persistently elevated, you may need to begin taking a medication at some point. You may return here within the next few days to recheck if unable to see your primary care provider or if you do not have a one.  BP (!) 158/111 (BP Location: Left Arm)   Pulse 80   Temp 98 F (36.7 C) (Oral)   Resp 20   LMP 05/02/2023 (Approximate)   SpO2 97%   BP Readings from Last 3 Encounters:  05/20/23 (!) 158/111  03/14/23 (!) 148/92  09/01/22 (!) 162/126       Reviewed expectations re: course of current medical issues. Questions answered. Outlined signs and symptoms indicating need for more acute intervention. Patient verbalized understanding. After Visit Summary given.   SUBJECTIVE:  Morgan Kirk is a 28 y.o. female who presents with a possible infection of her L axilla; painful; x several days; denies drainage. Also desires STD screening.  Increased blood pressure noted today. Reports that she is treated for HTN. Taking meds as directed.   OBJECTIVE:  Vitals:   05/20/23 1240  BP: (!) 158/111  Pulse: 80  Resp: 20  Temp: 98 F (36.7 C)  TempSrc: Oral  SpO2: 97%    General appearance: alert; no distress CV: ref L axilla: approx 2x1 cm induration; tender to touch; no active drainage  or bleeding Psychological: alert and cooperative; normal mood and affect  No Known Allergies  Past Medical History:  Diagnosis Date   Chlamydia    Hypertension    Ovarian cyst    Ovarian cyst    Social History   Socioeconomic History   Marital status: Single    Spouse name: Not on file   Number of children: Not on file   Years of education: Not on file   Highest education level: Not on file  Occupational History   Not on file  Tobacco Use   Smoking status:  Former    Types: Cigarettes   Smokeless tobacco: Never  Vaping Use   Vaping status: Never Used  Substance and Sexual Activity   Alcohol use: Yes    Comment: occasional   Drug use: Yes    Frequency: 3.0 times per week    Types: Marijuana   Sexual activity: Yes    Birth control/protection: Condom  Other Topics Concern   Not on file  Social History Narrative   Not on file   Social Drivers of Health   Financial Resource Strain: Not on file  Food Insecurity: Not on file  Transportation Needs: Not on file  Physical Activity: Not on file  Stress: Not on file  Social Connections: Not on file   Family History  Problem Relation Age of Onset   Hypertension Maternal Grandmother    Cancer Neg Hx    Diabetes Neg Hx    Past Surgical History:  Procedure Laterality Date   NO PAST SURGERIES              Afton Albright, MD 05/22/23 [phone]

## 2023-05-23 ENCOUNTER — Ambulatory Visit: Payer: Medicaid Other | Admitting: Family Medicine

## 2023-05-30 ENCOUNTER — Ambulatory Visit: Admitting: Family Medicine
